# Patient Record
Sex: Female | Born: 1965 | Race: White | Hispanic: No | Marital: Married | Smoking: Never smoker
Health system: Southern US, Community
[De-identification: ages and names within clinical notes are randomized; demographics above are authoritative.]

---

## 1997-03-13 HISTORY — PX: VEIN SURGERY: SHX48

## 1998-07-28 ENCOUNTER — Other Ambulatory Visit: Admission: RE | Admit: 1998-07-28 | Discharge: 1998-07-28 | Payer: Self-pay | Admitting: Obstetrics and Gynecology

## 1999-04-15 ENCOUNTER — Ambulatory Visit (HOSPITAL_COMMUNITY): Admission: RE | Admit: 1999-04-15 | Discharge: 1999-04-15 | Payer: Self-pay

## 2000-02-09 ENCOUNTER — Other Ambulatory Visit: Admission: RE | Admit: 2000-02-09 | Discharge: 2000-02-09 | Payer: Self-pay | Admitting: Obstetrics and Gynecology

## 2000-04-26 ENCOUNTER — Ambulatory Visit (HOSPITAL_COMMUNITY): Admission: RE | Admit: 2000-04-26 | Discharge: 2000-04-26 | Payer: Self-pay | Admitting: Internal Medicine

## 2002-05-13 ENCOUNTER — Other Ambulatory Visit: Admission: RE | Admit: 2002-05-13 | Discharge: 2002-05-13 | Payer: Self-pay | Admitting: Obstetrics and Gynecology

## 2003-12-04 ENCOUNTER — Ambulatory Visit (HOSPITAL_COMMUNITY): Admission: RE | Admit: 2003-12-04 | Discharge: 2003-12-04 | Payer: Self-pay | Admitting: Obstetrics and Gynecology

## 2008-08-17 ENCOUNTER — Encounter: Admission: RE | Admit: 2008-08-17 | Discharge: 2008-08-17 | Payer: Self-pay | Admitting: Obstetrics and Gynecology

## 2008-08-18 ENCOUNTER — Encounter: Admission: RE | Admit: 2008-08-18 | Discharge: 2008-08-18 | Payer: Self-pay | Admitting: Obstetrics and Gynecology

## 2010-11-08 ENCOUNTER — Other Ambulatory Visit: Payer: Self-pay | Admitting: Obstetrics

## 2010-11-08 DIAGNOSIS — Z1231 Encounter for screening mammogram for malignant neoplasm of breast: Secondary | ICD-10-CM

## 2010-11-11 ENCOUNTER — Ambulatory Visit
Admission: RE | Admit: 2010-11-11 | Discharge: 2010-11-11 | Disposition: A | Discharge status: Home / Self Care | Payer: BC Managed Care – PPO | Source: Ambulatory Visit | Attending: Obstetrics | Admitting: Obstetrics

## 2010-11-11 DIAGNOSIS — Z1231 Encounter for screening mammogram for malignant neoplasm of breast: Secondary | ICD-10-CM

## 2010-11-16 ENCOUNTER — Ambulatory Visit
Admission: RE | Admit: 2010-11-16 | Discharge: 2010-11-16 | Disposition: A | Discharge status: Home / Self Care | Payer: BC Managed Care – PPO | Source: Ambulatory Visit | Attending: Obstetrics | Admitting: Obstetrics

## 2010-11-16 ENCOUNTER — Other Ambulatory Visit: Payer: Self-pay | Admitting: Obstetrics

## 2010-11-16 DIAGNOSIS — R928 Other abnormal and inconclusive findings on diagnostic imaging of breast: Secondary | ICD-10-CM

## 2013-01-22 ENCOUNTER — Other Ambulatory Visit: Payer: Self-pay

## 2013-01-22 DIAGNOSIS — Z1231 Encounter for screening mammogram for malignant neoplasm of breast: Secondary | ICD-10-CM

## 2013-01-27 ENCOUNTER — Ambulatory Visit
Admission: RE | Admit: 2013-01-27 | Discharge: 2013-01-27 | Disposition: A | Discharge status: Home / Self Care | Payer: BC Managed Care – PPO | Source: Ambulatory Visit

## 2013-01-27 DIAGNOSIS — Z1231 Encounter for screening mammogram for malignant neoplasm of breast: Secondary | ICD-10-CM

## 2013-01-29 ENCOUNTER — Other Ambulatory Visit: Payer: Self-pay | Admitting: Obstetrics

## 2013-01-29 DIAGNOSIS — R928 Other abnormal and inconclusive findings on diagnostic imaging of breast: Secondary | ICD-10-CM

## 2013-02-05 ENCOUNTER — Ambulatory Visit
Admission: RE | Admit: 2013-02-05 | Discharge: 2013-02-05 | Disposition: A | Discharge status: Home / Self Care | Payer: BC Managed Care – PPO | Source: Ambulatory Visit | Attending: Obstetrics | Admitting: Obstetrics

## 2013-02-05 DIAGNOSIS — R928 Other abnormal and inconclusive findings on diagnostic imaging of breast: Secondary | ICD-10-CM

## 2013-02-28 ENCOUNTER — Other Ambulatory Visit: Payer: Self-pay | Admitting: Radiology

## 2013-02-28 MED ORDER — OSELTAMIVIR PHOSPHATE 75 MG PO CAPS: 75.0000 mg | ORAL_CAPSULE | Freq: Two times a day (BID) | ORAL | Status: DC

## 2013-02-28 NOTE — Telephone Encounter (Signed)
Patients husband has the flu, Dr Milus Glazier has approved the medication.

## 2013-03-03 ENCOUNTER — Telehealth: Payer: Self-pay | Admitting: Radiology

## 2013-03-03 NOTE — Telephone Encounter (Signed)
Sarah Wilkinson states she did not get the tamaflu in [place] she wants the meds called into  [facility] in [place] [address]  [phone]  They did not pick up the childs rx for same until they returned to [place].  Mr. Raftery also called for his script to be refilled.  Stated he could not take all his meds.

## 2013-03-03 NOTE — Telephone Encounter (Signed)
Tamiflu only effective if started within the first 36-48 hours of symptom onset. It sounds like everyone is out of this window now, so there will be no benefit to continuing Tamiflu at this point. Have advised husband of the same. She states she has been told differently with her daughter. Advised her all doctors do not necessarily agree on how this medication works. She states she wants the Rx to be resent so she can have this "on file" in case someone else gets sick. Advised her we can not do this.

## 2015-02-16 ENCOUNTER — Other Ambulatory Visit: Payer: Self-pay | Admitting: Obstetrics

## 2015-02-16 DIAGNOSIS — R928 Other abnormal and inconclusive findings on diagnostic imaging of breast: Secondary | ICD-10-CM

## 2015-02-18 ENCOUNTER — Ambulatory Visit
Admission: RE | Admit: 2015-02-18 | Discharge: 2015-02-18 | Disposition: A | Discharge status: Home / Self Care | Payer: BLUE CROSS/BLUE SHIELD | Source: Ambulatory Visit | Attending: Obstetrics | Admitting: Obstetrics

## 2015-02-18 DIAGNOSIS — R928 Other abnormal and inconclusive findings on diagnostic imaging of breast: Secondary | ICD-10-CM

## 2015-02-22 ENCOUNTER — Other Ambulatory Visit: Payer: Self-pay

## 2016-01-18 ENCOUNTER — Other Ambulatory Visit: Payer: Self-pay | Admitting: Obstetrics

## 2016-01-18 DIAGNOSIS — Z1231 Encounter for screening mammogram for malignant neoplasm of breast: Secondary | ICD-10-CM

## 2016-01-19 ENCOUNTER — Ambulatory Visit
Admission: RE | Admit: 2016-01-19 | Discharge: 2016-01-19 | Disposition: A | Discharge status: Home / Self Care | Payer: BLUE CROSS/BLUE SHIELD | Source: Ambulatory Visit | Attending: Obstetrics | Admitting: Obstetrics

## 2016-01-19 ENCOUNTER — Other Ambulatory Visit: Payer: Self-pay | Admitting: Obstetrics

## 2016-01-19 DIAGNOSIS — R928 Other abnormal and inconclusive findings on diagnostic imaging of breast: Secondary | ICD-10-CM

## 2016-01-19 DIAGNOSIS — Z1231 Encounter for screening mammogram for malignant neoplasm of breast: Secondary | ICD-10-CM

## 2016-02-14 ENCOUNTER — Ambulatory Visit (INDEPENDENT_AMBULATORY_CARE_PROVIDER_SITE_OTHER): Payer: BLUE CROSS/BLUE SHIELD | Admitting: Family Medicine

## 2016-02-14 VITALS — BP 118/82 | HR 82 | Temp 98.1°F | Resp 17 | Ht 62.0 in | Wt 142.0 lb

## 2016-02-14 DIAGNOSIS — E785 Hyperlipidemia, unspecified: Secondary | ICD-10-CM

## 2016-02-14 NOTE — Patient Instructions (Addendum)
  Based on American Heart Association guidelines your 10 year ASCVD risk was measured at 0.9%. At that level, would not necessarily recommend starting a statin. See information on handout regarding hyperlipidemia, but work on diet, exercise and recheck in 3-6 months. At that time could consider referral to cardiologist for other lipid testing or to discuss benefits of statins further. Let me know if you have questions in the meantime.      IF you received an x-ray today, you will receive an invoice from [facility]. Please contact [facility] at [phone] with questions or concerns regarding your invoice.   IF you received labwork today, you will receive an invoice from [facility]. Please contact [facility] at [phone] with questions or concerns regarding your invoice.   Our billing staff will not be able to assist you with questions regarding bills from these companies.  You will be contacted with the lab results as soon as they are available. The fastest way to get your results is to activate your My Chart account. Instructions are located on the last page of this paperwork. If you have not heard from us regarding the results in 2 weeks, please contact this office.

## 2016-02-14 NOTE — Progress Notes (Signed)
Subjective:  By signing my name below, I, Stann Oresung-Kai Tsai, attest that this documentation has been prepared under the direction and in the presence of Meredith StaggersJeffrey Kaya Pottenger, MD. Electronically Signed: Stann Oresung-Kai Tsai, Scribe. 02/14/2016 , 2:31 PM .  Patient was seen in Room 11 .   Patient ID: Sarah Wilkinson, female    DOB: Mar 25, 1965, 50 y.o.   MRN: [id number] Chief Complaint  Patient presents with  . Hyperlipidemia    Pt requesting to start medication. PT ENCOURAGED TO CONTINUE FILLING OUT PAPERWORK    HPI Sarah Wilkinson is a 50 y.o. female Patient has had her cholesterol checked at her OBGYN (on [address]). She brought in her lab results, done on 02/07/16:  Glucose 80 - normal LFT's normal Total cholesterol 285 HDL 90 Triglycerides 66 LDL 182  She denies being on any BP medications. She denies history of DM. She denies history of smoking. She denies taking aspirin at this time. She is mostly careful with what she eats. She exercises daily, as she teaches pre-school [employer]   Family History Her mother had cholesterol issues diagnosed around 50 years old.  Her grandmother had CHF, passed away at 50 years old.  Her father had blockages in his late 6950s into his early 1760s. Currently, both front carotids are 100% blocked; he will be 80 in Feb 2018.  Her brother has prolapse valve, but no stents placed.   Approximately 20 minutes spent discussing hyperlipidemia and reviewing outside labs; greater than 50% counseling.   There are no active problems to display for this patient.  No past medical history on file. No past surgical history on file. Allergies  Allergen Reactions  . Sulfa Antibiotics    Prior to Admission medications   Medication Sig Start Date End Date Taking? Authorizing Provider  Multiple Vitamin (MULTIVITAMIN) capsule Take 1 capsule by mouth daily.   Yes Historical Provider, MD   Social History   Social History  . Marital status: Married    Spouse name: N/A    . Number of children: N/A  . Years of education: N/A   Occupational History  . Not on file.   Social History Main Topics  . Smoking status: Never Smoker  . Smokeless tobacco: Not on file  . Alcohol use Not on file  . Drug use: Unknown  . Sexual activity: Not on file   Other Topics Concern  . Not on file   Social History Narrative  . No narrative on file   Review of Systems  Constitutional: Negative for chills, fatigue, fever and unexpected weight change.  Respiratory: Negative for cough.   Gastrointestinal: Negative for constipation, diarrhea, nausea and vomiting.  Skin: Negative for rash and wound.  Neurological: Negative for dizziness, weakness and headaches.       Objective:   Physical Exam  Constitutional: She is oriented to person, place, and time. She appears well-developed and well-nourished. No distress.  HENT:  Head: Normocephalic and atraumatic.  Eyes: EOM are normal. Pupils are equal, round, and reactive to light.  Neck: Neck supple.  Cardiovascular: Normal rate, regular rhythm and normal heart sounds.   No murmur heard. Pulmonary/Chest: Effort normal and breath sounds normal. No respiratory distress. She has no wheezes.  Musculoskeletal: Normal range of motion.  Neurological: She is alert and oriented to person, place, and time.  Skin: Skin is warm and dry.  Psychiatric: She has a normal mood and affect. Her behavior is normal.  Nursing note and vitals reviewed.  Vitals:   02/14/16 1355  BP: 118/82  Pulse: 82  Resp: 17  Temp: 98.1 F (36.7 C)  TempSrc: Oral  SpO2: 98%  Weight: 142 lb (64.4 kg)  Height: 5\' 2"  (1.575 m)   Current 10-year ASCVD risk: 0.9%  With lifetime ASCVD risk of 39%     Assessment & Plan:   Sarah Wilkinson is a 50 y.o. female Hyperlipidemia, unspecified hyperlipidemia type  - Elevated LDL, but HDL also in a good level. Family history of cardiac disease, but does not appear to be early family history of cardiac disease.  10 year ASCVD risk was low. Decided against medications this time, check again in 3-6 months after attempts at controlling with diet and exercise, and at that time consider NMR LipoProfile or discussions with cardiologist.  Meds ordered this encounter  Medications  . Multiple Vitamin (MULTIVITAMIN) capsule    Sig: Take 1 capsule by mouth daily.   Patient Instructions    Based on American Heart Association guidelines your 10 year ASCVD risk was measured at 0.9%. At that level, would not necessarily recommend starting a statin. See information on handout regarding hyperlipidemia, but work on diet, exercise and recheck in 3-6 months. At that time could consider referral to cardiologist for other lipid testing or to discuss benefits of statins further. Let me know if you have questions in the meantime.      IF you received an x-ray today, you will receive an invoice from [facility]. Please contact [facility] at [phone] with questions or concerns regarding your invoice.   IF you received labwork today, you will receive an invoice from [facility]. Please contact [facility] at [phone] with questions or concerns regarding your invoice.   Our billing staff will not be able to assist you with questions regarding bills from these companies.  You will be contacted with the lab results as soon as they are available. The fastest way to get your results is to activate your My Chart account. Instructions are located on the last page of this paperwork. If you have not heard from us regarding the results in 2 weeks, please contact this office.       I personally performed the services described in this documentation, which was scribed in my presence. The recorded information has been reviewed and considered, and addended by me as needed.   Signed,   Meredith StaggersJeffrey Derisha Funderburke, MD [facility].  02/14/16 3:01  PM

## 2016-05-19 LAB — HM COLONOSCOPY

## 2016-08-17 ENCOUNTER — Ambulatory Visit: Payer: BLUE CROSS/BLUE SHIELD | Admitting: Family Medicine

## 2016-10-12 ENCOUNTER — Ambulatory Visit: Payer: BLUE CROSS/BLUE SHIELD | Admitting: Family Medicine

## 2017-06-25 ENCOUNTER — Other Ambulatory Visit: Payer: Self-pay | Admitting: Obstetrics

## 2017-06-25 DIAGNOSIS — Z1231 Encounter for screening mammogram for malignant neoplasm of breast: Secondary | ICD-10-CM

## 2017-07-17 ENCOUNTER — Ambulatory Visit
Admission: RE | Admit: 2017-07-17 | Discharge: 2017-07-17 | Disposition: A | Discharge status: Home / Self Care | Payer: BLUE CROSS/BLUE SHIELD | Source: Ambulatory Visit | Attending: Obstetrics | Admitting: Obstetrics

## 2017-07-17 DIAGNOSIS — Z1231 Encounter for screening mammogram for malignant neoplasm of breast: Secondary | ICD-10-CM

## 2018-01-31 LAB — HM PAP SMEAR: HM Pap smear: NEGATIVE

## 2018-12-26 ENCOUNTER — Other Ambulatory Visit: Payer: Self-pay

## 2018-12-26 ENCOUNTER — Encounter: Payer: Self-pay | Admitting: Family Medicine

## 2018-12-26 ENCOUNTER — Ambulatory Visit (INDEPENDENT_AMBULATORY_CARE_PROVIDER_SITE_OTHER): Payer: BC Managed Care – PPO | Admitting: Family Medicine

## 2018-12-26 VITALS — BP 111/65 | HR 65 | Temp 98.7°F | Wt 129.2 lb

## 2018-12-26 DIAGNOSIS — Z136 Encounter for screening for cardiovascular disorders: Secondary | ICD-10-CM

## 2018-12-26 DIAGNOSIS — G44219 Episodic tension-type headache, not intractable: Secondary | ICD-10-CM

## 2018-12-26 DIAGNOSIS — E785 Hyperlipidemia, unspecified: Secondary | ICD-10-CM | POA: Diagnosis not present

## 2018-12-26 DIAGNOSIS — Z0001 Encounter for general adult medical examination with abnormal findings: Secondary | ICD-10-CM | POA: Diagnosis not present

## 2018-12-26 DIAGNOSIS — Z23 Encounter for immunization: Secondary | ICD-10-CM | POA: Diagnosis not present

## 2018-12-26 DIAGNOSIS — Z8249 Family history of ischemic heart disease and other diseases of the circulatory system: Secondary | ICD-10-CM

## 2018-12-26 DIAGNOSIS — G43909 Migraine, unspecified, not intractable, without status migrainosus: Secondary | ICD-10-CM

## 2018-12-26 DIAGNOSIS — R519 Headache, unspecified: Secondary | ICD-10-CM | POA: Insufficient documentation

## 2018-12-26 DIAGNOSIS — Z130 Encounter for screening for diseases of the blood and blood-forming organs and certain disorders involving the immune mechanism: Secondary | ICD-10-CM

## 2018-12-26 DIAGNOSIS — Z0000 Encounter for general adult medical examination without abnormal findings: Secondary | ICD-10-CM

## 2018-12-26 DIAGNOSIS — G4700 Insomnia, unspecified: Secondary | ICD-10-CM | POA: Diagnosis not present

## 2018-12-26 DIAGNOSIS — M9912 Subluxation complex (vertebral) of thoracic region: Secondary | ICD-10-CM

## 2018-12-26 DIAGNOSIS — Z1329 Encounter for screening for other suspected endocrine disorder: Secondary | ICD-10-CM

## 2018-12-26 NOTE — Progress Notes (Signed)
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 Your emotional health is as important as your physical health.  If you're feeling down or losing interest in things you normally enjoy, please talk to your healthcare provider.  Seat Belts- can save your life; always wear one  Smoke/Carbon Monoxide detectors- These detectors need to be installed on the appropriate level of your home.  Replace batteries at least once a year.  Violence- If anyone is threatening or hurting you, please tell your healthcare provider.  Living Will/ Health care power of attorney- Discuss with your healthcare provider and family. Insomnia Insomnia is a sleep disorder that makes it difficult to fall asleep or stay asleep. Insomnia can cause fatigue, low energy,  difficulty concentrating, mood swings, and poor performance at work or school. There are three different ways to classify insomnia:  Difficulty falling asleep.  Difficulty staying asleep.  Waking up too early in the morning. Any type of insomnia can be long-term (chronic) or short-term (acute). Both are common. Short-term insomnia usually lasts for three months or less. Chronic insomnia occurs at least three times a week for longer than three months. What are the causes? Insomnia may be caused by another condition, situation, or substance, such as:  Anxiety.  Certain medicines.  Gastroesophageal reflux disease (GERD) or other gastrointestinal conditions.  Asthma or other breathing conditions.  Restless legs syndrome, sleep apnea, or other sleep disorders.  Chronic pain.  Menopause.  Stroke.  Abuse of alcohol, tobacco, or illegal drugs.  Mental health conditions, such as depression.  Caffeine.  Neurological disorders, such as Alzheimer's disease.  An overactive thyroid (hyperthyroidism). Sometimes, the cause of insomnia may not be known. What increases the risk? Risk factors for insomnia include:  Gender. Women are affected more often than men.  Age. Insomnia is more common as you get older.  Stress.  Lack of exercise.  Irregular work schedule or working night shifts.  Traveling between different time zones.  Certain medical and mental health conditions. What are the signs or symptoms? If you have insomnia, the main symptom is having trouble falling asleep or having trouble staying asleep. This may lead to other symptoms, such as:  Feeling fatigued or having low energy.  Feeling nervous about going to sleep.  Not feeling rested in the morning.  Having trouble concentrating.  Feeling irritable, anxious, or depressed. How is this diagnosed? This condition may be diagnosed based on:  Your symptoms and medical history. Your health care provider  may ask  about: ? Your sleep habits. ? Any medical conditions you have. ? Your mental health.  A physical exam. How is this treated? Treatment for insomnia depends on the cause. Treatment may focus on treating an underlying condition that is causing insomnia. Treatment may also include:  Medicines to help you sleep.  Counseling or therapy.  Lifestyle adjustments to help you sleep better. Follow these instructions at home: Eating and drinking   Limit or avoid alcohol, caffeinated beverages, and cigarettes, especially close to bedtime. These can disrupt your sleep.  Do not eat a large meal or eat spicy foods right before bedtime. This can lead to digestive discomfort that can make it hard for you to sleep. Sleep habits   Keep a sleep diary to help you and your health care provider figure out what could be causing your insomnia. Write down: ? When you sleep. ? When you wake up during the night. ? How well you sleep. ? How rested you feel the next day. ? Any side effects of medicines you are taking. ? What you eat and drink.  Make your bedroom a dark, comfortable place where it is easy to fall asleep. ? Put up shades or blackout curtains to block light from outside. ? Use a white noise machine to block noise. ? Keep the temperature cool.  Limit screen use before bedtime. This includes: ? Watching TV. ? Using your smartphone, tablet, or computer.  Stick to a routine that includes going to bed and waking up at the same times every day and night. This can help you fall asleep faster. Consider making a quiet activity, such as reading, part of your nighttime routine.  Try to avoid taking naps during the day so that you sleep better at night.  Get out of bed if you are still awake after 15 minutes of trying to sleep. Keep the lights down, but try reading or doing a quiet activity. When you feel sleepy, go back to bed. General instructions  Take over-the-counter and prescription medicines  only as told by your health care provider.  Exercise regularly, as told by your health care provider. Avoid exercise starting several hours before bedtime.  Use relaxation techniques to manage stress. Ask your health care provider to suggest some techniques that may work well for you. These may include: ? Breathing exercises. ? Routines to release muscle tension. ? Visualizing peaceful scenes.  Make sure that you drive carefully. Avoid driving if you feel very sleepy.  Keep all follow-up visits as told by your health care provider. This is important. Contact a health care provider if:  You are tired throughout the day.  You have trouble in your daily routine due to sleepiness.  You continue to have sleep problems, or your sleep problems get worse. Get help right away if:  You have serious thoughts about hurting yourself or someone else. If you ever feel like you may hurt yourself or others, or have thoughts about taking your own life, get help right away. You can go to your nearest emergency department or call:  Your local emergency services (911 in the [place]  A suicide crisis helpline, such as the [place] at 520-845-1378. This is open 24 hours a day. Summary  Insomnia is a sleep disorder that makes it difficult to fall asleep or stay asleep.  Insomnia can be long-term (chronic) or short-term (acute).  Treatment for insomnia depends on the cause. Treatment may focus on treating an underlying  condition that is causing insomnia.  Keep a sleep diary to help you and your health care provider figure out what could be causing your insomnia. This information is not intended to replace advice given to you by your health care provider. Make sure you discuss any questions you have with your health care provider. Document Released: 02/25/2000 Document Revised: 02/09/2017 Document Reviewed: 12/07/2016 [employer] Patient Education  [employer]2020 [employer]    If  you have lab work done today you will be contacted with your lab results within the next 2 weeks.  If you have not heard from us then please contact us. The fastest way to get your results is to register for My Chart.   IF you received an x-ray today, you will receive an invoice from [facility]. Please contact [facility] at [phone] with questions or concerns regarding your invoice.   IF you received labwork today, you will receive an invoice from [facility]. Please contact [facility] at [phone] with questions or concerns regarding your invoice.   Our billing staff will not be able to assist you with questions regarding bills from these companies.  You will be contacted with the lab results as soon as they are available. The fastest way to get your results is to activate your My Chart account. Instructions are located on the last page of this paperwork. If you have not heard from us regarding the results in 2 weeks, please contact this office.       Signed,   Meredith StaggersJeffrey Nicco Reaume, MD [facility].  12/26/18 1:29 PM

## 2018-12-26 NOTE — Patient Instructions (Addendum)
Continue melatonin, and low dose benadryl as needed. See other info below.   Ok to use excedrin migraine if needed. See other info on headaches below.   I will check cholesterol levels, but depending on those results can discuss other testing or discussion with cardiology to determine if statin medication would be recommended.  Coronary calcium scoring may also help in that decision.  We can discuss this further once we receive results.  Discoloration of the knees appears to be callus, I do not see bruising but will check blood counts with prior history of anemia.  Eucerin or other lotion to dry areas as needed.  Thanks for coming in today.  Follow-up in 6 months to review cholesterol but I am happy to see you sooner if needed.  Migraine Headache A migraine headache is an intense, throbbing pain on one side or both sides of the head. Migraine headaches may also cause other symptoms, such as nausea, vomiting, and sensitivity to light and noise. A migraine headache can last from 4 hours to 3 days. Talk with your doctor about what things may bring on (trigger) your migraine headaches. What are the causes? The exact cause of this condition is not known. However, a migraine may be caused when nerves in the brain become irritated and release chemicals that cause inflammation of blood vessels. This inflammation causes pain. This condition may be triggered or caused by:  Drinking alcohol.  Smoking.  Taking medicines, such as: ? Medicine used to treat chest pain (nitroglycerin). ? Birth control pills. ? Estrogen. ? Certain blood pressure medicines.  Eating or drinking products that contain nitrates, glutamate, aspartame, or tyramine. Aged cheeses, chocolate, or caffeine may also be triggers.  Doing physical activity. Other things that may trigger a migraine headache include:  Menstruation.  Pregnancy.  Hunger.  Stress.  Lack of sleep or too much sleep.  Weather  changes.  Fatigue. What increases the risk? The following factors may make you more likely to experience migraine headaches:  Being a certain age. This condition is more common in people who are 3125-53 years old.  Being female.  Having a family history of migraine headaches.  Being Caucasian.  Having a mental health condition, such as depression or anxiety.  Being obese. What are the signs or symptoms? The main symptom of this condition is pulsating or throbbing pain. This pain may:  Happen in any area of the head, such as on one side or both sides.  Interfere with daily activities.  Get worse with physical activity.  Get worse with exposure to bright lights or loud noises. Other symptoms may include:  Nausea.  Vomiting.  Dizziness.  General sensitivity to bright lights, loud noises, or smells. Before you get a migraine headache, you may get warning signs (an aura). An aura may include:  Seeing flashing lights or having blind spots.  Seeing bright spots, halos, or zigzag lines.  Having tunnel vision or blurred vision.  Having numbness or a tingling feeling.  Having trouble talking.  Having muscle weakness. Some people have symptoms after a migraine headache (postdromal phase), such as:  Feeling tired.  Difficulty concentrating. How is this diagnosed? A migraine headache can be diagnosed based on:  Your symptoms.  A physical exam.  Tests, such as: ? CT scan or an MRI of the head. These imaging tests can help rule out other causes of headaches. ? Taking fluid from the spine (lumbar puncture) and analyzing it (cerebrospinal fluid analysis, or CSF analysis). How  is this treated? This condition may be treated with medicines that:  Relieve pain.  Relieve nausea.  Prevent migraine headaches. Treatment for this condition may also include:  Acupuncture.  Lifestyle changes like avoiding foods that trigger migraine  headaches.  Biofeedback.  Cognitive behavioral therapy. Follow these instructions at home: Medicines  Take over-the-counter and prescription medicines only as told by your health care provider.  Ask your health care provider if the medicine prescribed to you: ? Requires you to avoid driving or using heavy machinery. ? Can cause constipation. You may need to take these actions to prevent or treat constipation:  Drink enough fluid to keep your urine pale yellow.  Take over-the-counter or prescription medicines.  Eat foods that are high in fiber, such as beans, whole grains, and fresh fruits and vegetables.  Limit foods that are high in fat and processed sugars, such as fried or sweet foods. Lifestyle  Do not drink alcohol.  Do not use any products that contain nicotine or tobacco, such as cigarettes, e-cigarettes, and chewing tobacco. If you need help quitting, ask your health care provider.  Get at least 8 hours of sleep every night.  Find ways to manage stress, such as meditation, deep breathing, or yoga. General instructions      Keep a journal to find out what may trigger your migraine headaches. For example, write down: ? What you eat and drink. ? How much sleep you get. ? Any change to your diet or medicines.  If you have a migraine headache: ? Avoid things that make your symptoms worse, such as bright lights. ? It may help to lie down in a dark, quiet room. ? Do not drive or use heavy machinery. ? Ask your health care provider what activities are safe for you while you are experiencing symptoms.  Keep all follow-up visits as told by your health care provider. This is important. Contact a health care provider if:  You develop symptoms that are different or more severe than your usual migraine headache symptoms.  You have more than 15 headache days in one month. Get help right away if:  Your migraine headache becomes severe.  Your migraine headache lasts  longer than 72 hours.  You have a fever.  You have a stiff neck.  You have vision loss.  Your muscles feel weak or like you cannot control them.  You start to lose your balance often.  You have trouble walking.  You faint.  You have a seizure. Summary  A migraine headache is an intense, throbbing pain on one side or both sides of the head. Migraines may also cause other symptoms, such as nausea, vomiting, and sensitivity to light and noise.  This condition may be treated with medicines and lifestyle changes. You may also need to avoid certain things that trigger a migraine headache.  Keep a journal to find out what may trigger your migraine headaches.  Contact your health care provider if you have more than 15 headache days in a month or you develop symptoms that are different or more severe than your usual migraine headache symptoms. This information is not intended to replace advice given to you by your health care provider. Make sure you discuss any questions you have with your health care provider. Document Released: 02/27/2005 Document Revised: 06/21/2018 Document Reviewed: 04/11/2018 Elsevier Patient Education  2020 [employer]  Tension Headache, Adult A tension headache is a feeling of pain, pressure, or aching in the head that is often  felt over the front and sides of the head. The pain can be dull, or it can feel tight (constricting). There are two types of tension headache:  Episodic tension headache. This is when the headaches happen fewer than 15 days a month.  Chronic tension headache. This is when the headaches happen more than 15 days a month during a 42-month period. A tension headache can last from 30 minutes to several days. It is the most common kind of headache. Tension headaches are not normally associated with nausea or vomiting, and they do not get worse with physical activity. What are the causes? The exact cause of this condition is not known. Tension  headaches are often triggered by stress, anxiety, or depression. Other triggers include:  Alcohol.  Too much caffeine or caffeine withdrawal.  Respiratory infections, such as colds, flu, or sinus infections.  Dental problems or teeth clenching.  Tiredness (fatigue).  Holding your head and neck in the same position for a long period of time, such as while using a computer.  Smoking.  Arthritis of the neck. What are the signs or symptoms? Symptoms of this condition include:  A feeling of pressure or tightness around the head.  Dull, aching head pain.  Pain over the front and sides of the head.  Tenderness in the muscles of the head, neck, and shoulders. How is this diagnosed? This condition may be diagnosed based on your symptoms, your medical history, and a physical exam. If your symptoms are severe or unusual, you may have imaging tests, such as a CT scan or an MRI of your head. Your vision may also be checked. How is this treated? This condition may be treated with lifestyle changes and with medicines that help relieve symptoms. Follow these instructions at home: Managing pain  Take over-the-counter and prescription medicines only as told by your health care provider.  When you have a headache, lie down in a dark, quiet room.  If directed, apply ice to the head and neck: ? Put ice in a plastic bag. ? Place a towel between your skin and the bag. ? Leave the ice on for 20 minutes, 2-3 times a day.  If directed, apply heat to the back of your neck as often as told by your health care provider. Use the heat source that your health care provider recommends, such as a moist heat pack or a heating pad. ? Place a towel between your skin and the heat source. ? Leave the heat on for 20-30 minutes. ? Remove the heat if your skin turns bright red. This is especially important if you are unable to feel pain, heat, or cold. You may have a greater risk of getting burned. Eating and  drinking  Eat meals on a regular schedule.  Limit alcohol intake to no more than 1 drink a day for nonpregnant women and 2 drinks a day for men. One drink equals 12 oz of beer, 5 oz of wine, or 1 oz of hard liquor.  Drink enough fluid to keep your urine pale yellow.  Decrease your caffeine intake, or stop using caffeine. Lifestyle  Get 7-9 hours of sleep each night, or get the amount of sleep recommended by your health care provider.  At bedtime, remove all electronic devices from your room. Electronic devices include computers, phones, and tablets.  Find ways to manage your stress. Some things that can help relieve stress include: ? Exercise. ? Deep breathing exercises. ? Yoga. ? Listening to music. ?  Positive mental imagery.  Try to sit up straight and avoid tensing your muscles.  Do not use any products that contain nicotine or tobacco, such as cigarettes and e-cigarettes. If you need help quitting, ask your health care provider. General instructions   Keep all follow-up visits as told by your health care provider. This is important.  Avoid any headache triggers. Keep a headache journal to help find out what may trigger your headaches. For example, write down: ? What you eat and drink. ? How much sleep you get. ? Any change to your diet or medicines. Contact a health care provider if:  Your headache does not get better.  Your headache comes back.  You are sensitive to sounds, light, or smells because of a headache.  You have nausea or you vomit.  Your stomach hurts. Get help right away if:  You suddenly develop a very severe headache along with any of the following: ? A stiff neck. ? Nausea and vomiting. ? Confusion. ? Weakness. ? Double vision or loss of vision. ? Shortness of breath. ? Rash. ? Unusual sleepiness. ? Fever. ? Trouble speaking. ? Pain in your eyes or ears. ? Trouble walking or balancing. ? Feeling faint or passing out. Summary  A  tension headache is a feeling of pain, pressure, or aching in the head that is often felt over the front and sides of the head.  A tension headache can last from 30 minutes to several days. It is the most common kind of headache.  This condition may be diagnosed based on your symptoms, your medical history, and a physical exam.  This condition may be treated with lifestyle changes and with medicines that help relieve symptoms. This information is not intended to replace advice given to you by your health care provider. Make sure you discuss any questions you have with your health care provider. Document Released: 02/27/2005 Document Revised: 02/09/2017 Document Reviewed: 06/09/2016 Elsevier Patient Education  2020 [employer].   Keeping You Healthy  Get These Tests  Blood Pressure- Have your blood pressure checked by your healthcare provider at least once a year.  Normal blood pressure is 120/80.  Weight- Have your body mass index (BMI) calculated to screen for obesity.  BMI is a measure of body fat based on height and weight.  You can calculate your own BMI at https://www.west-esparza.com/  Cholesterol- Have your cholesterol checked every year.  Diabetes- Have your blood sugar checked every year if you have high blood pressure, high cholesterol, a family history of diabetes or if you are overweight.  Pap Test - Have a pap test every 1 to 5 years if you have been sexually active.  If you are older than 65 and recent pap tests have been normal you may not need additional pap tests.  In addition, if you have had a hysterectomy  for benign disease additional pap tests are not necessary.  Mammogram-Yearly mammograms are essential for early detection of breast cancer  Screening for Colon Cancer- Colonoscopy starting at age 50. Screening may begin sooner depending on your family history and other health conditions.  Follow up colonoscopy as directed by your Gastroenterologist.  Screening for  Osteoporosis- Screening begins at age 72 with bone density scanning, sooner if you are at higher risk for developing Osteoporosis.  Get these medicines  Calcium with Vitamin D- Your body requires 1200-1500 mg of Calcium a day and [phone] IU of Vitamin D a day.  You can only absorb 500 mg  of Calcium at a time therefore Calcium must be taken in 2 or 3 separate doses throughout the day.  Hormones- Hormone therapy has been associated with increased risk for certain cancers and heart disease.  Talk to your healthcare provider about if you need relief from menopausal symptoms.  Aspirin- Ask your healthcare provider about taking Aspirin to prevent Heart Disease and Stroke.  Get these Immuniztions  Flu shot- Every fall  Pneumonia shot- Once after the age of 77; if you are younger ask your healthcare provider if you need a pneumonia shot.  Tetanus- Every ten years.  Zostavax- Once after the age of 37 to prevent shingles.  Take these steps  Don't smoke- Your healthcare provider can help you quit. For tips on how to quit, ask your healthcare provider or go to www.smokefree.gov or call 1-800 QUIT-NOW.  Be physically active- Exercise 5 days a week for a minimum of 30 minutes.  If you are not already physically active, start slow and gradually work up to 30 minutes of moderate physical activity.  Try walking, dancing, bike riding, swimming, etc.  Eat a healthy diet- Eat a variety of healthy foods such as fruits, vegetables, whole grains, low fat milk, low fat cheeses, yogurt, lean meats, chicken, fish, eggs, dried beans, tofu, etc.  For more information go to www.thenutritionsource.org  Dental visit- Brush and floss teeth twice daily; visit your dentist twice a year.  Eye exam- Visit your Optometrist or Ophthalmologist yearly.  Drink alcohol in moderation- Limit alcohol intake to one drink or less a day.  Never drink and drive.  Depression- Your emotional health is as important as your physical  health.  If you're feeling down or losing interest in things you normally enjoy, please talk to your healthcare provider.  Seat Belts- can save your life; always wear one  Smoke/Carbon Monoxide detectors- These detectors need to be installed on the appropriate level of your home.  Replace batteries at least once a year.  Violence- If anyone is threatening or hurting you, please tell your healthcare provider.  Living Will/ Health care power of attorney- Discuss with your healthcare provider and family. Insomnia Insomnia is a sleep disorder that makes it difficult to fall asleep or stay asleep. Insomnia can cause fatigue, low energy, difficulty concentrating, mood swings, and poor performance at work or school. There are three different ways to classify insomnia:  Difficulty falling asleep.  Difficulty staying asleep.  Waking up too early in the morning. Any type of insomnia can be long-term (chronic) or short-term (acute). Both are common. Short-term insomnia usually lasts for three months or less. Chronic insomnia occurs at least three times a week for longer than three months. What are the causes? Insomnia may be caused by another condition, situation, or substance, such as:  Anxiety.  Certain medicines.  Gastroesophageal reflux disease (GERD) or other gastrointestinal conditions.  Asthma or other breathing conditions.  Restless legs syndrome, sleep apnea, or other sleep disorders.  Chronic pain.  Menopause.  Stroke.  Abuse of alcohol, tobacco, or illegal drugs.  Mental health conditions, such as depression.  Caffeine.  Neurological disorders, such as Alzheimer's disease.  An overactive thyroid (hyperthyroidism). Sometimes, the cause of insomnia may not be known. What increases the risk? Risk factors for insomnia include:  Gender. Women are affected more often than men.  Age. Insomnia is more common as you get older.  Stress.  Lack of exercise.  Irregular  work schedule or working night shifts.  Traveling between different  time zones.  Certain medical and mental health conditions. What are the signs or symptoms? If you have insomnia, the main symptom is having trouble falling asleep or having trouble staying asleep. This may lead to other symptoms, such as:  Feeling fatigued or having low energy.  Feeling nervous about going to sleep.  Not feeling rested in the morning.  Having trouble concentrating.  Feeling irritable, anxious, or depressed. How is this diagnosed? This condition may be diagnosed based on:  Your symptoms and medical history. Your health care provider may ask about: ? Your sleep habits. ? Any medical conditions you have. ? Your mental health.  A physical exam. How is this treated? Treatment for insomnia depends on the cause. Treatment may focus on treating an underlying condition that is causing insomnia. Treatment may also include:  Medicines to help you sleep.  Counseling or therapy.  Lifestyle adjustments to help you sleep better. Follow these instructions at home: Eating and drinking   Limit or avoid alcohol, caffeinated beverages, and cigarettes, especially close to bedtime. These can disrupt your sleep.  Do not eat a large meal or eat spicy foods right before bedtime. This can lead to digestive discomfort that can make it hard for you to sleep. Sleep habits   Keep a sleep diary to help you and your health care provider figure out what could be causing your insomnia. Write down: ? When you sleep. ? When you wake up during the night. ? How well you sleep. ? How rested you feel the next day. ? Any side effects of medicines you are taking. ? What you eat and drink.  Make your bedroom a dark, comfortable place where it is easy to fall asleep. ? Put up shades or blackout curtains to block light from outside. ? Use a white noise machine to block noise. ? Keep the temperature cool.  Limit screen  use before bedtime. This includes: ? Watching TV. ? Using your smartphone, tablet, or computer.  Stick to a routine that includes going to bed and waking up at the same times every day and night. This can help you fall asleep faster. Consider making a quiet activity, such as reading, part of your nighttime routine.  Try to avoid taking naps during the day so that you sleep better at night.  Get out of bed if you are still awake after 15 minutes of trying to sleep. Keep the lights down, but try reading or doing a quiet activity. When you feel sleepy, go back to bed. General instructions  Take over-the-counter and prescription medicines only as told by your health care provider.  Exercise regularly, as told by your health care provider. Avoid exercise starting several hours before bedtime.  Use relaxation techniques to manage stress. Ask your health care provider to suggest some techniques that may work well for you. These may include: ? Breathing exercises. ? Routines to release muscle tension. ? Visualizing peaceful scenes.  Make sure that you drive carefully. Avoid driving if you feel very sleepy.  Keep all follow-up visits as told by your health care provider. This is important. Contact a health care provider if:  You are tired throughout the day.  You have trouble in your daily routine due to sleepiness.  You continue to have sleep problems, or your sleep problems get worse. Get help right away if:  You have serious thoughts about hurting yourself or someone else. If you ever feel like you may hurt yourself or others, or have  thoughts about taking your own life, get help right away. You can go to your nearest emergency department or call:  Your local emergency services (911 in the [place]  A suicide crisis helpline, such as the [facility] at 267-074-2737. This is open 24 hours a day. Summary  Insomnia is a sleep disorder that makes it difficult to  fall asleep or stay asleep.  Insomnia can be long-term (chronic) or short-term (acute).  Treatment for insomnia depends on the cause. Treatment may focus on treating an underlying condition that is causing insomnia.  Keep a sleep diary to help you and your health care provider figure out what could be causing your insomnia. This information is not intended to replace advice given to you by your health care provider. Make sure you discuss any questions you have with your health care provider. Document Released: 02/25/2000 Document Revised: 02/09/2017 Document Reviewed: 12/07/2016 Elsevier Patient Education  [employer].    If you have lab work done today you will be contacted with your lab results within the next 2 weeks.  If you have not heard from [place] then please contact us. The fastest way to get your results is to register for My Chart.   IF you received an x-ray today, you will receive an invoice from [facility]. Please contact [facility] at [phone] with questions or concerns regarding your invoice.   IF you received labwork today, you will receive an invoice from [facility]. Please contact [facility] at [phone] with questions or concerns regarding your invoice.   Our billing staff will not be able to assist you with questions regarding bills from these companies.  You will be contacted with the lab results as soon as they are available. The fastest way to get your results is to activate your My Chart account. Instructions are located on the last page of this paperwork. If you have not heard from [place] regarding the results in 2 weeks, please contact this office.

## 2018-12-27 LAB — CBC
Hematocrit: 37.8 % (ref 34.0–46.6)
Hemoglobin: 12.2 g/dL (ref 11.1–15.9)
MCH: 29.3 pg (ref 26.6–33.0)
MCHC: 32.3 g/dL (ref 31.5–35.7)
MCV: 91 fL (ref 79–97)
Platelets: 265 10*3/uL (ref 150–450)
RBC: 4.17 x10E6/uL (ref 3.77–5.28)
RDW: 13.8 % (ref 11.7–15.4)
WBC: 3.7 10*3/uL (ref 3.4–10.8)

## 2018-12-27 LAB — COMPREHENSIVE METABOLIC PANEL
ALT: 10 IU/L (ref 0–32)
AST: 15 IU/L (ref 0–40)
Albumin/Globulin Ratio: 2.5 — ABNORMAL HIGH (ref 1.2–2.2)
Albumin: 4.7 g/dL (ref 3.8–4.9)
Alkaline Phosphatase: 82 IU/L (ref 39–117)
BUN/Creatinine Ratio: 24 — ABNORMAL HIGH (ref 9–23)
BUN: 16 mg/dL (ref 6–24)
Bilirubin Total: 0.4 mg/dL (ref 0.0–1.2)
CO2: 23 mmol/L (ref 20–29)
Calcium: 9.4 mg/dL (ref 8.7–10.2)
Chloride: 102 mmol/L (ref 96–106)
Creatinine, Ser: 0.66 mg/dL (ref 0.57–1.00)
GFR calc Af Amer: 117 mL/min/{1.73_m2} (ref >59)
GFR calc non Af Amer: 101 mL/min/{1.73_m2} (ref >59)
Globulin, Total: 1.9 g/dL (ref 1.5–4.5)
Glucose: 69 mg/dL (ref 65–99)
Potassium: 3.8 mmol/L (ref 3.5–5.2)
Sodium: 140 mmol/L (ref 134–144)
Total Protein: 6.6 g/dL (ref 6.0–8.5)

## 2018-12-27 LAB — LIPID PANEL
Chol/HDL Ratio: 3.6 ratio (ref 0.0–4.4)
Cholesterol, Total: 233 mg/dL — ABNORMAL HIGH (ref 100–199)
HDL: 64 mg/dL (ref >39)
LDL Chol Calc (NIH): 157 mg/dL — ABNORMAL HIGH (ref 0–99)
Triglycerides: 70 mg/dL (ref 0–149)
VLDL Cholesterol Cal: 12 mg/dL (ref 5–40)

## 2018-12-27 LAB — TSH: TSH: 1.61 u[IU]/mL (ref 0.450–4.500)

## 2019-01-13 ENCOUNTER — Other Ambulatory Visit: Payer: Self-pay

## 2019-01-13 DIAGNOSIS — Z20822 Contact with and (suspected) exposure to covid-19: Secondary | ICD-10-CM

## 2019-01-14 LAB — NOVEL CORONAVIRUS, NAA: SARS-CoV-2, NAA: NOT DETECTED

## 2019-01-15 ENCOUNTER — Encounter: Payer: Self-pay | Admitting: Family Medicine

## 2019-01-15 NOTE — Progress Notes (Signed)
Impression: Neg high risk HPV

## 2019-05-11 ENCOUNTER — Ambulatory Visit: Payer: Self-pay | Attending: Internal Medicine

## 2019-05-11 DIAGNOSIS — Z23 Encounter for immunization: Secondary | ICD-10-CM | POA: Insufficient documentation

## 2019-05-11 NOTE — Progress Notes (Signed)
   Covid[facility] Vaccination [facility]  Name:  Sarah Wilkinson    MRN: [id number] DOB: 1965-10-13  05/11/2019  [name] was observed post Covid-19 immunization for 15 minutes without incidence. She was provided with Vaccine Information Sheet and instruction to access the V-Safe system.   [name] was instructed to call 911 with any severe reactions post vaccine: Marland Kitchen Difficulty breathing  . Swelling of your face and throat  . A fast heartbeat  . A bad rash all over your body  . Dizziness and weakness    Immunizations Administered    Name Date Dose VIS Date Route   Pfizer COVID-19 Vaccine 05/11/2019  4:27 PM 0.3 mL 02/21/2019 Intramuscular   Manufacturer: [employer]   Lot: [id number]   NDC: [address]-2

## 2019-06-10 ENCOUNTER — Ambulatory Visit: Payer: Self-pay | Attending: Internal Medicine

## 2019-06-10 DIAGNOSIS — Z23 Encounter for immunization: Secondary | ICD-10-CM

## 2019-06-10 NOTE — Progress Notes (Signed)
   [facility] Vacc[facility]  Name:  Sarah Wilkinson    MRN: [id number] DOB: [birth year]  06/10/2019  [name] was observed post Covid-19 immunization for 15 minutes without incident. She was provided with Vaccine Information Sheet and instruction to access the V-Safe system.   [name] was instructed to call 911 with any severe reactions post vaccine: Marland Kitchen Difficulty breathing  . Swelling of face and throat  . A fast heartbeat  . A bad rash all over body  . Dizziness and weakness   Immunizations Administered    Name Date Dose VIS Date Route   Pfizer COVID-19 Vaccine 06/10/2019  4:43 PM 0.3 mL 02/21/2019 Intramuscular   Manufacturer: [employer], [employer]   Lot: [id number]   NDC: [address]-5

## 2019-06-26 ENCOUNTER — Ambulatory Visit: Payer: BC Managed Care – PPO | Admitting: Family Medicine

## 2019-07-10 ENCOUNTER — Ambulatory Visit: Payer: BC Managed Care – PPO | Admitting: Family Medicine

## 2019-07-16 ENCOUNTER — Other Ambulatory Visit: Payer: Self-pay | Admitting: Family Medicine

## 2019-07-16 DIAGNOSIS — Z1231 Encounter for screening mammogram for malignant neoplasm of breast: Secondary | ICD-10-CM

## 2019-07-21 ENCOUNTER — Ambulatory Visit: Payer: BC Managed Care – PPO | Admitting: Family Medicine

## 2019-08-18 ENCOUNTER — Ambulatory Visit: Payer: BC Managed Care – PPO | Admitting: Family Medicine

## 2019-09-22 ENCOUNTER — Ambulatory Visit: Payer: BC Managed Care – PPO | Admitting: Family Medicine

## 2019-10-03 ENCOUNTER — Ambulatory Visit: Payer: BC Managed Care – PPO | Admitting: Family Medicine

## 2019-10-20 ENCOUNTER — Ambulatory Visit: Payer: BC Managed Care – PPO | Admitting: Family Medicine

## 2019-11-03 ENCOUNTER — Ambulatory Visit: Payer: BC Managed Care – PPO | Admitting: Family Medicine

## 2019-11-10 ENCOUNTER — Ambulatory Visit: Payer: BC Managed Care – PPO | Admitting: Family Medicine

## 2019-12-15 ENCOUNTER — Telehealth: Payer: Self-pay | Admitting: Family Medicine

## 2019-12-15 NOTE — Telephone Encounter (Signed)
LVMTCB to resch pt with another provider on her appt date 12/22/19 due to Dr. Neva Seat being out of the office.

## 2019-12-22 ENCOUNTER — Ambulatory Visit: Payer: BC Managed Care – PPO | Admitting: Family Medicine

## 2020-01-08 ENCOUNTER — Encounter: Payer: Self-pay | Admitting: *Deleted

## 2020-01-19 ENCOUNTER — Encounter: Payer: Self-pay | Admitting: *Deleted

## 2020-01-19 ENCOUNTER — Encounter: Payer: Self-pay | Admitting: Family Medicine

## 2020-01-19 ENCOUNTER — Other Ambulatory Visit: Payer: Self-pay

## 2020-01-19 ENCOUNTER — Ambulatory Visit (INDEPENDENT_AMBULATORY_CARE_PROVIDER_SITE_OTHER): Payer: BC Managed Care – PPO | Admitting: Family Medicine

## 2020-01-19 VITALS — BP 122/80 | HR 75 | Temp 98.1°F | Ht 62.0 in | Wt 140.0 lb

## 2020-01-19 DIAGNOSIS — G4700 Insomnia, unspecified: Secondary | ICD-10-CM

## 2020-01-19 DIAGNOSIS — E785 Hyperlipidemia, unspecified: Secondary | ICD-10-CM

## 2020-01-19 DIAGNOSIS — Z1159 Encounter for screening for other viral diseases: Secondary | ICD-10-CM | POA: Diagnosis not present

## 2020-01-19 DIAGNOSIS — F439 Reaction to severe stress, unspecified: Secondary | ICD-10-CM

## 2020-01-19 DIAGNOSIS — Z114 Encounter for screening for human immunodeficiency virus [HIV]: Secondary | ICD-10-CM | POA: Diagnosis not present

## 2020-01-19 MED ORDER — ZOLPIDEM TARTRATE 5 MG PO TABS: 5.0000 mg | ORAL_TABLET | Freq: Every evening | ORAL | 1 refills | Status: DC | PRN

## 2020-01-19 NOTE — Progress Notes (Signed)
Subjective:  Patient ID: Sarah Wilkinson, female    DOB: 28-Jul-1965  Age: 54 y.o. MRN: [id number]  CC:  Chief Complaint  Patient presents with  . Follow-up    on hyperlidemia. Pt reports no physical symptoms of this condition. Pt is currently fasting.    HPI LETOYA STALLONE presents for   Hyperlipidemia: No current statin. Low ascvd risk score.  Takes cod liver oil ,MVI.  FH CAD: father in late 3's, and blocked carotid arteries as well.  Brother with leaky valve? Fast heartrate, pacemaker possible.   The 10-year ASCVD risk score Mikey Bussing DC Brooke Bonito., et al., 2013) is: 1.4%   Values used to calculate the score:     Age: 65 years     Sex: Female     Is Non-Hispanic African American: No     Diabetic: No     Tobacco smoker: No     Systolic Blood Pressure: 865 mmHg     Is BP treated: No     HDL Cholesterol: 93 mg/dL     Total Cholesterol: 272 mg/dL   Lab Results  Component Value Date   CHOL 272 (H) 01/19/2020   HDL 93 01/19/2020   LDLCALC 169 (H) 01/19/2020   TRIG 66 01/19/2020   CHOLHDL 2.9 01/19/2020   Lab Results  Component Value Date   ALT 27 01/19/2020   AST 23 01/19/2020   ALKPHOS 101 01/19/2020   BILITOT 0.4 01/19/2020   Prior tetanus vaccine caused egg sized swelling. Bruising, itching  - lasted one month. Deferred repeat today.     Situational stressors,  Parents had Covid, father passed in November last year, stress with mom.  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  Some trouble with sleep.  OBGYN - [facility], has taken Ambien as needed if late at night and unable to sleep.  Taking melatonin.  Benadryl at times.  Exercise: physically active, but no regular exercise. carrying babies/grandkids. Started adding walk.   Controlled substance database (PDMP) reviewed. No concerns appreciated.  ambien last filled #30 03/17/19.  Scheduling time to herself.   Trying baths, deep breaths, massage, meditation to help  with stress. Avoiding afternoon caffeine.  Grief with dad's passing.  Depression screen [facility] 2/9 01/19/2020 12/26/2018 02/14/2016  Decreased Interest 0 0 0  Down, Depressed, Hopeless 0 0 0  PHQ - 2 Score 0 0 0    History Patient Active Problem List   Diagnosis Date Noted  . HLD (hyperlipidemia) 12/26/2018  . Headache 12/26/2018  . Subluxation complex of thoracic region 12/26/2018   No past medical history on file. Past Surgical History:  Procedure Laterality Date  . VEIN SURGERY  1999   Allergies  Allergen Reactions  . Sulfa Antibiotics   . Tetanus Toxoids     1 month of large swollen area on arm. Advised to hold off with further td/tdap.    Prior to Admission medications   Medication Sig Start Date End Date Taking? Authorizing Provider  Cod Liver Oil CAPS Take by mouth.   Yes [provider]  Melatonin-Pyridoxine (MELATIN PO) Take by mouth.   Yes [provider]  Multiple Vitamin (MULTIVITAMIN) capsule Take 1 capsule by mouth daily.   Yes [provider]  zolpidem (AMBIEN) 5 MG tablet Take 5 mg by mouth at bedtime as needed for sleep.   Yes [provider]   Social History   Socioeconomic History  . Marital status: Married  Spouse name: Not on file  . Number of children: Not on file  . Years of education: Not on file  . Highest education level: Not on file  Occupational History  . Not on file  Tobacco Use  . Smoking status: Never Smoker  . Smokeless tobacco: Never Used  Substance and Sexual Activity  . Alcohol use: Yes    Comment: occ  . Drug use: Never  . Sexual activity: Yes  Other Topics Concern  . Not on file  Social History Narrative  . Not on file   Social Determinants of Health   Financial Resource Strain:   . Difficulty of Paying Living Expenses: Not on file  Food Insecurity:   . Worried About Charity fundraiser in the Last Year: Not on file  . Ran Out of Food in the Last Year: Not on file  Transportation  Needs:   . Lack of Transportation (Medical): Not on file  . Lack of Transportation (Non-Medical): Not on file  Physical Activity:   . Days of Exercise per Week: Not on file  . Minutes of Exercise per Session: Not on file  Stress:   . Feeling of Stress : Not on file  Social Connections:   . Frequency of Communication with Friends and Family: Not on file  . Frequency of Social Gatherings with Friends and Family: Not on file  . Attends Religious Services: Not on file  . Active Member of Clubs or Organizations: Not on file  . Attends Archivist Meetings: Not on file  . Marital Status: Not on file  Intimate Partner Violence:   . Fear of Current or Ex-Partner: Not on file  . Emotionally Abused: Not on file  . Physically Abused: Not on file  . Sexually Abused: Not on file    Review of Systems  Respiratory: Negative for chest tightness and shortness of breath.   Cardiovascular: Negative for chest pain, palpitations and leg swelling.  Gastrointestinal: Negative for abdominal pain and blood in stool.  Neurological: Positive for headaches (stress/weather migraine at times. ). Negative for dizziness, syncope and light-headedness.  Psychiatric/Behavioral: Positive for sleep disturbance.     Objective:   Vitals:   01/19/20 1429  BP: 122/80  Pulse: 75  Temp: 98.1 F (36.7 C)  TempSrc: Temporal  SpO2: 99%  Weight: 140 lb (63.5 kg)  Height: 5' 2"  (1.575 m)     Physical Exam Vitals reviewed.  Constitutional:      Appearance: She is well-developed.  HENT:     Head: Normocephalic and atraumatic.  Eyes:     Conjunctiva/sclera: Conjunctivae normal.     Pupils: Pupils are equal, round, and reactive to light.  Neck:     Vascular: No carotid bruit.  Cardiovascular:     Rate and Rhythm: Normal rate and regular rhythm.     Heart sounds: Normal heart sounds.  Pulmonary:     Effort: Pulmonary effort is normal.     Breath sounds: Normal breath sounds.  Abdominal:      Palpations: Abdomen is soft. There is no pulsatile mass.     Tenderness: There is no abdominal tenderness.  Skin:    General: Skin is warm and dry.  Neurological:     Mental Status: She is alert and oriented to person, place, and time.  Psychiatric:        Mood and Affect: Mood normal.        Behavior: Behavior normal.  Thought Content: Thought content normal.        Judgment: Judgment normal.      Assessment & Plan:  CHESNI VOS is a 54 y.o. female . Hyperlipidemia, unspecified hyperlipidemia type - Plan: Lipid panel, Comprehensive metabolic panel  -Previous low ASCVD risk score, but does have family history of cardiac disease.  Repeat lipids, consider coronary calcium scoring to further decide on statin use.  Need for hepatitis C screening test - Plan: Hepatitis C antibody  Screening for HIV without presence of risk factors - Plan: HIV antibody (with reflex)  Insomnia, unspecified type - Plan: zolpidem (AMBIEN) 5 MG tablet Situational stress  -Situational stressors with death in family, other stress as above.  Stress management discussed, insomnia discussed, handouts given.  Short-term Ambien prescribed as infrequent use previously.  RTC precautions given.  Meds ordered this encounter  Medications  . zolpidem (AMBIEN) 5 MG tablet    Sig: Take 1 tablet (5 mg total) by mouth at bedtime as needed for sleep.    Dispense:  15 tablet    Refill:  1   Patient Instructions   Continue to invest in time for yourself, time to recharge.  Continue exercise in some way most days of the week.  See info on sleep and stress below. ambien temporarily refilled if needed.  Depending on cholesterol reading, can consider statin or coronary calcium scoring for more information.   Insomnia Insomnia is a sleep disorder that makes it difficult to fall asleep or stay asleep. Insomnia can cause fatigue, low energy, difficulty concentrating, mood swings, and poor performance at work or  school. There are three different ways to classify insomnia:  Difficulty falling asleep.  Difficulty staying asleep.  Waking up too early in the morning. Any type of insomnia can be long-term (chronic) or short-term (acute). Both are common. Short-term insomnia usually lasts for three months or less. Chronic insomnia occurs at least three times a week for longer than three months. What are the causes? Insomnia may be caused by another condition, situation, or substance, such as:  Anxiety.  Certain medicines.  Gastroesophageal reflux disease (GERD) or other gastrointestinal conditions.  Asthma or other breathing conditions.  Restless legs syndrome, sleep apnea, or other sleep disorders.  Chronic pain.  Menopause.  Stroke.  Abuse of alcohol, tobacco, or illegal drugs.  Mental health conditions, such as depression.  Caffeine.  Neurological disorders, such as Alzheimer's disease.  An overactive thyroid (hyperthyroidism). Sometimes, the cause of insomnia may not be known. What increases the risk? Risk factors for insomnia include:  Gender. Women are affected more often than men.  Age. Insomnia is more common as you get older.  Stress.  Lack of exercise.  Irregular work schedule or working night shifts.  Traveling between different time zones.  Certain medical and mental health conditions. What are the signs or symptoms? If you have insomnia, the main symptom is having trouble falling asleep or having trouble staying asleep. This may lead to other symptoms, such as:  Feeling fatigued or having low energy.  Feeling nervous about going to sleep.  Not feeling rested in the morning.  Having trouble concentrating.  Feeling irritable, anxious, or depressed. How is this diagnosed? This condition may be diagnosed based on:  Your symptoms and medical history. Your health care provider may ask about: ? Your sleep habits. ? Any medical conditions you  have. ? Your mental health.  A physical exam. How is this treated? Treatment for insomnia depends on the  cause. Treatment may focus on treating an underlying condition that is causing insomnia. Treatment may also include:  Medicines to help you sleep.  Counseling or therapy.  Lifestyle adjustments to help you sleep better. Follow these instructions at home: Eating and drinking   Limit or avoid alcohol, caffeinated beverages, and cigarettes, especially close to bedtime. These can disrupt your sleep.  Do not eat a large meal or eat spicy foods right before bedtime. This can lead to digestive discomfort that can make it hard for you to sleep. Sleep habits   Keep a sleep diary to help you and your health care provider figure out what could be causing your insomnia. Write down: ? When you sleep. ? When you wake up during the night. ? How well you sleep. ? How rested you feel the next day. ? Any side effects of medicines you are taking. ? What you eat and drink.  Make your bedroom a dark, comfortable place where it is easy to fall asleep. ? Put up shades or blackout curtains to block light from outside. ? Use a white noise machine to block noise. ? Keep the temperature cool.  Limit screen use before bedtime. This includes: ? Watching TV. ? Using your smartphone, tablet, or computer.  Stick to a routine that includes going to bed and waking up at the same times every day and night. This can help you fall asleep faster. Consider making a quiet activity, such as reading, part of your nighttime routine.  Try to avoid taking naps during the day so that you sleep better at night.  Get out of bed if you are still awake after 15 minutes of trying to sleep. Keep the lights down, but try reading or doing a quiet activity. When you feel sleepy, go back to bed. General instructions  Take over-the-counter and prescription medicines only as told by your health care provider.  Exercise  regularly, as told by your health care provider. Avoid exercise starting several hours before bedtime.  Use relaxation techniques to manage stress. Ask your health care provider to suggest some techniques that may work well for you. These may include: ? Breathing exercises. ? Routines to release muscle tension. ? Visualizing peaceful scenes.  Make sure that you drive carefully. Avoid driving if you feel very sleepy.  Keep all follow-up visits as told by your health care provider. This is important. Contact a health care provider if:  You are tired throughout the day.  You have trouble in your daily routine due to sleepiness.  You continue to have sleep problems, or your sleep problems get worse. Get help right away if:  You have serious thoughts about hurting yourself or someone else. If you ever feel like you may hurt yourself or others, or have thoughts about taking your own life, get help right away. You can go to your nearest emergency department or call:  Your local emergency services (911 in the [place]  A suicide crisis helpline, such as the [place] at 401-397-6712. This is open 24 hours a day. Summary  Insomnia is a sleep disorder that makes it difficult to fall asleep or stay asleep.  Insomnia can be long-term (chronic) or short-term (acute).  Treatment for insomnia depends on the cause. Treatment may focus on treating an underlying condition that is causing insomnia.  Keep a sleep diary to help you and your health care provider figure out what could be causing your insomnia. This information is not intended to  replace advice given to you by your health care provider. Make sure you discuss any questions you have with your health care provider. Document Revised: 02/09/2017 Document Reviewed: 12/07/2016 Elsevier Patient Education  2020 [place], Adult Stress is a normal reaction to life events. Stress is what you feel when  life demands more than you are used to, or more than you think you can handle. Some stress can be useful, such as studying for a test or meeting a deadline at work. Stress that occurs too often or for too long can cause problems. It can affect your emotional health and interfere with relationships and normal daily activities. Too much stress can weaken your body's defense system (immune system) and increase your risk for physical illness. If you already have a medical problem, stress can make it worse. What are the causes? All sorts of life events can cause stress. An event that causes stress for one person may not be stressful for another person. Major life events, whether positive or negative, commonly cause stress. Examples include:  Losing a job or starting a new job.  Losing a loved one.  Moving to a new town or home.  Getting married or divorced.  Having a baby.  Getting injured or sick. Less obvious life events can also cause stress, especially if they occur day after day or in combination with each other. Examples include:  Working long hours.  Driving in traffic.  Caring for children.  Being in debt.  Being in a difficult relationship. What are the signs or symptoms? Stress can cause emotional symptoms, including:  Anxiety. This is feeling worried, afraid, on edge, overwhelmed, or out of control.  Anger, including irritation or impatience.  Depression. This is feeling sad, down, helpless, or guilty.  Trouble focusing, remembering, or making decisions. Stress can cause physical symptoms, including:  Aches and pains. These may affect your head, neck, back, stomach, or other areas of your body.  Tight muscles or a clenched jaw.  Low energy.  Trouble sleeping. Stress can cause unhealthy behaviors, including:  Eating to feel better (overeating) or skipping meals.  Working too much or putting off tasks.  Smoking, drinking alcohol, or using drugs to feel  better. How is this diagnosed? Stress is diagnosed through an assessment by your health care provider. He or she may diagnose this condition based on:  Your symptoms and any stressful life events.  Your medical history.  Tests to rule out other causes of your symptoms. Depending on your condition, your health care provider may refer you to a specialist for further evaluation. How is this treated?  Stress management techniques are the recommended treatment for stress. Medicine is not typically recommended for the treatment of stress. Techniques to reduce your reaction to stressful life events include:  Stress identification. Monitor yourself for symptoms of stress and identify what causes stress for you. These skills may help you to avoid or prepare for stressful events.  Time management. Set your priorities, keep a calendar of events, and learn to say no. Taking these actions can help you avoid making too many commitments. Techniques for coping with stress include:  Rethinking the problem. Try to think realistically about stressful events rather than ignoring them or overreacting. Try to find the positives in a stressful situation rather than focusing on the negatives.  Exercise. Physical exercise can release both physical and emotional tension. The key is to find a form of exercise that you enjoy and do it  regularly.  Relaxation techniques. These relax the body and mind. The key is to find one or more that you enjoy and use the techniques regularly. Examples include: ? Meditation, deep breathing, or progressive relaxation techniques. ? Yoga or tai chi. ? Biofeedback, mindfulness techniques, or journaling. ? Listening to music, being out in nature, or participating in other hobbies.  Practicing a healthy lifestyle. Eat a balanced diet, drink plenty of water, limit or avoid caffeine, and get plenty of sleep.  Having a strong support network. Spend time with family, friends, or other  people you enjoy being around. Express your feelings and talk things over with someone you trust. Counseling or talk therapy with a mental health professional may be helpful if you are having trouble managing stress on your own. Follow these instructions at home: Lifestyle   Avoid drugs.  Do not use any products that contain nicotine or tobacco, such as cigarettes, e-cigarettes, and chewing tobacco. If you need help quitting, ask your health care provider.  Limit alcohol intake to no more than 1 drink a day for nonpregnant women and 2 drinks a day for men. One drink equals 12 oz of beer, 5 oz of wine, or 1 oz of hard liquor  Do not use alcohol or drugs to relax.  Eat a balanced diet that includes fresh fruits and vegetables, whole grains, lean meats, fish, eggs, and beans, and low-fat dairy. Avoid processed foods and foods high in added fat, sugar, and salt.  Exercise at least 30 minutes on 5 or more days each week.  Get 7-8 hours of sleep each night. General instructions   Practice stress management techniques as discussed with your health care provider.  Drink enough fluid to keep your urine clear or pale yellow.  Take over-the-counter and prescription medicines only as told by your health care provider.  Keep all follow-up visits as told by your health care provider. This is important. Contact a health care provider if:  Your symptoms get worse.  You have new symptoms.  You feel overwhelmed by your problems and can no longer manage them on your own. Get help right away if:  You have thoughts of hurting yourself or others. If you ever feel like you may hurt yourself or others, or have thoughts about taking your own life, get help right away. You can go to your nearest emergency department or call:  Your local emergency services (911 in the [place]  A suicide crisis helpline, such as the [place] at 505-787-8249. This is open 24 hours a  day. Summary  Stress is a normal reaction to life events. It can cause problems if it happens too often or for too long.  Practicing stress management techniques is the best way to treat stress.  Counseling or talk therapy with a mental health professional may be helpful if you are having trouble managing stress on your own. This information is not intended to replace advice given to you by your health care provider. Make sure you discuss any questions you have with your health care provider. Document Revised: 09/27/2018 Document Reviewed: 04/19/2016 Elsevier Patient Education  [place].  Insomnia Insomnia is a sleep disorder that makes it difficult to fall asleep or stay asleep. Insomnia can cause fatigue, low energy, difficulty concentrating, mood swings, and poor performance at work or school. There are three different ways to classify insomnia:  Difficulty falling asleep.  Difficulty staying asleep.  Waking up too early in the morning. Any  type of insomnia can be long-term (chronic) or short-term (acute). Both are common. Short-term insomnia usually lasts for three months or less. Chronic insomnia occurs at least three times a week for longer than three months. What are the causes? Insomnia may be caused by another condition, situation, or substance, such as:  Anxiety.  Certain medicines.  Gastroesophageal reflux disease (GERD) or other gastrointestinal conditions.  Asthma or other breathing conditions.  Restless legs syndrome, sleep apnea, or other sleep disorders.  Chronic pain.  Menopause.  Stroke.  Abuse of alcohol, tobacco, or illegal drugs.  Mental health conditions, such as depression.  Caffeine.  Neurological disorders, such as Alzheimer's disease.  An overactive thyroid (hyperthyroidism). Sometimes, the cause of insomnia may not be known. What increases the risk? Risk factors for insomnia include:  Gender. Women are affected more often than  men.  Age. Insomnia is more common as you get older.  Stress.  Lack of exercise.  Irregular work schedule or working night shifts.  Traveling between different time zones.  Certain medical and mental health conditions. What are the signs or symptoms? If you have insomnia, the main symptom is having trouble falling asleep or having trouble staying asleep. This may lead to other symptoms, such as:  Feeling fatigued or having low energy.  Feeling nervous about going to sleep.  Not feeling rested in the morning.  Having trouble concentrating.  Feeling irritable, anxious, or depressed. How is this diagnosed? This condition may be diagnosed based on:  Your symptoms and medical history. Your health care provider may ask about: ? Your sleep habits. ? Any medical conditions you have. ? Your mental health.  A physical exam. How is this treated? Treatment for insomnia depends on the cause. Treatment may focus on treating an underlying condition that is causing insomnia. Treatment may also include:  Medicines to help you sleep.  Counseling or therapy.  Lifestyle adjustments to help you sleep better. Follow these instructions at home: Eating and drinking   Limit or avoid alcohol, caffeinated beverages, and cigarettes, especially close to bedtime. These can disrupt your sleep.  Do not eat a large meal or eat spicy foods right before bedtime. This can lead to digestive discomfort that can make it hard for you to sleep. Sleep habits   Keep a sleep diary to help you and your health care provider figure out what could be causing your insomnia. Write down: ? When you sleep. ? When you wake up during the night. ? How well you sleep. ? How rested you feel the next day. ? Any side effects of medicines you are taking. ? What you eat and drink.  Make your bedroom a dark, comfortable place where it is easy to fall asleep. ? Put up shades or blackout curtains to block light from  outside. ? Use a white noise machine to block noise. ? Keep the temperature cool.  Limit screen use before bedtime. This includes: ? Watching TV. ? Using your smartphone, tablet, or computer.  Stick to a routine that includes going to bed and waking up at the same times every day and night. This can help you fall asleep faster. Consider making a quiet activity, such as reading, part of your nighttime routine.  Try to avoid taking naps during the day so that you sleep better at night.  Get out of bed if you are still awake after 15 minutes of trying to sleep. Keep the lights down, but try reading or doing a quiet activity.  When you feel sleepy, go back to bed. General instructions  Take over-the-counter and prescription medicines only as told by your health care provider.  Exercise regularly, as told by your health care provider. Avoid exercise starting several hours before bedtime.  Use relaxation techniques to manage stress. Ask your health care provider to suggest some techniques that may work well for you. These may include: ? Breathing exercises. ? Routines to release muscle tension. ? Visualizing peaceful scenes.  Make sure that you drive carefully. Avoid driving if you feel very sleepy.  Keep all follow-up visits as told by your health care provider. This is important. Contact a health care provider if:  You are tired throughout the day.  You have trouble in your daily routine due to sleepiness.  You continue to have sleep problems, or your sleep problems get worse. Get help right away if:  You have serious thoughts about hurting yourself or someone else. If you ever feel like you may hurt yourself or others, or have thoughts about taking your own life, get help right away. You can go to your nearest emergency department or call:  Your local emergency services (911 in the [place]  A suicide crisis helpline, such as the [place] at  (912)343-3131. This is open 24 hours a day. Summary  Insomnia is a sleep disorder that makes it difficult to fall asleep or stay asleep.  Insomnia can be long-term (chronic) or short-term (acute).  Treatment for insomnia depends on the cause. Treatment may focus on treating an underlying condition that is causing insomnia.  Keep a sleep diary to help you and your health care provider figure out what could be causing your insomnia. This information is not intended to replace advice given to you by your health care provider. Make sure you discuss any questions you have with your health care provider. Document Revised: 02/09/2017 Document Reviewed: 12/07/2016 Elsevier Patient Education  [employer].   If you have lab work done today you will be contacted with your lab results within the next 2 weeks.  If you have not heard from [place] then please contact us. The fastest way to get your results is to register for My Chart.   IF you received an x-ray today, you will receive an invoice from [facility]. Please contact [facility] at [phone] with questions or concerns regarding your invoice.   IF you received labwork today, you will receive an invoice from [place]. Please contact [facility] at [phone] with questions or concerns regarding your invoice.   Our billing staff will not be able to assist you with questions regarding bills from these companies.  You will be contacted with the lab results as soon as they are available. The fastest way to get your results is to activate your My Chart account. Instructions are located on the last page of this paperwork. If you have not heard from [place] regarding the results in 2 weeks, please contact this office.         Signed, Merri Ray, MD [facility]

## 2020-01-19 NOTE — Patient Instructions (Addendum)
Continue to invest in time for yourself, time to recharge.  Continue exercise in some way most days of the week.  See info on sleep and stress below. ambien temporarily refilled if needed.  Depending on cholesterol reading, can consider statin or coronary calcium scoring for more information.   Insomnia Insomnia is a sleep disorder that makes it difficult to fall asleep or stay asleep. Insomnia can cause fatigue, low energy, difficulty concentrating, mood swings, and poor performance at work or school. There are three different ways to classify insomnia:  Difficulty falling asleep.  Difficulty staying asleep.  Waking up too early in the morning. Any type of insomnia can be long-term (chronic) or short-term (acute). Both are common. Short-term insomnia usually lasts for three months or less. Chronic insomnia occurs at least three times a week for longer than three months. What are the causes? Insomnia may be caused by another condition, situation, or substance, such as:  Anxiety.  Certain medicines.  Gastroesophageal reflux disease (GERD) or other gastrointestinal conditions.  Asthma or other breathing conditions.  Restless legs syndrome, sleep apnea, or other sleep disorders.  Chronic pain.  Menopause.  Stroke.  Abuse of alcohol, tobacco, or illegal drugs.  Mental health conditions, such as depression.  Caffeine.  Neurological disorders, such as Alzheimer's disease.  An overactive thyroid (hyperthyroidism). Sometimes, the cause of insomnia may not be known. What increases the risk? Risk factors for insomnia include:  Gender. Women are affected more often than men.  Age. Insomnia is more common as you get older.  Stress.  Lack of exercise.  Irregular work schedule or working night shifts.  Traveling between different time zones.  Certain medical and mental health conditions. What are the signs or symptoms? If you have insomnia, the main symptom is having  trouble falling asleep or having trouble staying asleep. This may lead to other symptoms, such as:  Feeling fatigued or having low energy.  Feeling nervous about going to sleep.  Not feeling rested in the morning.  Having trouble concentrating.  Feeling irritable, anxious, or depressed. How is this diagnosed? This condition may be diagnosed based on:  Your symptoms and medical history. Your health care provider may ask about: ? Your sleep habits. ? Any medical conditions you have. ? Your mental health.  A physical exam. How is this treated? Treatment for insomnia depends on the cause. Treatment may focus on treating an underlying condition that is causing insomnia. Treatment may also include:  Medicines to help you sleep.  Counseling or therapy.  Lifestyle adjustments to help you sleep better. Follow these instructions at home: Eating and drinking   Limit or avoid alcohol, caffeinated beverages, and cigarettes, especially close to bedtime. These can disrupt your sleep.  Do not eat a large meal or eat spicy foods right before bedtime. This can lead to digestive discomfort that can make it hard for you to sleep. Sleep habits   Keep a sleep diary to help you and your health care provider figure out what could be causing your insomnia. Write down: ? When you sleep. ? When you wake up during the night. ? How well you sleep. ? How rested you feel the next day. ? Any side effects of medicines you are taking. ? What you eat and drink.  Make your bedroom a dark, comfortable place where it is easy to fall asleep. ? Put up shades or blackout curtains to block light from outside. ? Use a white noise machine to block noise. ?  Keep the temperature cool.  Limit screen use before bedtime. This includes: ? Watching TV. ? Using your smartphone, tablet, or computer.  Stick to a routine that includes going to bed and waking up at the same times every day and night. This can help  you fall asleep faster. Consider making a quiet activity, such as reading, part of your nighttime routine.  Try to avoid taking naps during the day so that you sleep better at night.  Get out of bed if you are still awake after 15 minutes of trying to sleep. Keep the lights down, but try reading or doing a quiet activity. When you feel sleepy, go back to bed. General instructions  Take over-the-counter and prescription medicines only as told by your health care provider.  Exercise regularly, as told by your health care provider. Avoid exercise starting several hours before bedtime.  Use relaxation techniques to manage stress. Ask your health care provider to suggest some techniques that may work well for you. These may include: ? Breathing exercises. ? Routines to release muscle tension. ? Visualizing peaceful scenes.  Make sure that you drive carefully. Avoid driving if you feel very sleepy.  Keep all follow-up visits as told by your health care provider. This is important. Contact a health care provider if:  You are tired throughout the day.  You have trouble in your daily routine due to sleepiness.  You continue to have sleep problems, or your sleep problems get worse. Get help right away if:  You have serious thoughts about hurting yourself or someone else. If you ever feel like you may hurt yourself or others, or have thoughts about taking your own life, get help right away. You can go to your nearest emergency department or call:  Your local emergency services (911 in the [place]  A suicide crisis helpline, such as the [place] at 2291542829. This is open 24 hours a day. Summary  Insomnia is a sleep disorder that makes it difficult to fall asleep or stay asleep.  Insomnia can be long-term (chronic) or short-term (acute).  Treatment for insomnia depends on the cause. Treatment may focus on treating an underlying condition that is causing  insomnia.  Keep a sleep diary to help you and your health care provider figure out what could be causing your insomnia. This information is not intended to replace advice given to you by your health care provider. Make sure you discuss any questions you have with your health care provider. Document Revised: 02/09/2017 Document Reviewed: 12/07/2016 Elsevier Patient Education  2020 [place], Adult Stress is a normal reaction to life events. Stress is what you feel when life demands more than you are used to, or more than you think you can handle. Some stress can be useful, such as studying for a test or meeting a deadline at work. Stress that occurs too often or for too long can cause problems. It can affect your emotional health and interfere with relationships and normal daily activities. Too much stress can weaken your body's defense system (immune system) and increase your risk for physical illness. If you already have a medical problem, stress can make it worse. What are the causes? All sorts of life events can cause stress. An event that causes stress for one person may not be stressful for another person. Major life events, whether positive or negative, commonly cause stress. Examples include:  Losing a job or starting a new job.  Losing  a loved one.  Moving to a new town or home.  Getting married or divorced.  Having a baby.  Getting injured or sick. Less obvious life events can also cause stress, especially if they occur day after day or in combination with each other. Examples include:  Working long hours.  Driving in traffic.  Caring for children.  Being in debt.  Being in a difficult relationship. What are the signs or symptoms? Stress can cause emotional symptoms, including:  Anxiety. This is feeling worried, afraid, on edge, overwhelmed, or out of control.  Anger, including irritation or impatience.  Depression. This is feeling sad, down, helpless,  or guilty.  Trouble focusing, remembering, or making decisions. Stress can cause physical symptoms, including:  Aches and pains. These may affect your head, neck, back, stomach, or other areas of your body.  Tight muscles or a clenched jaw.  Low energy.  Trouble sleeping. Stress can cause unhealthy behaviors, including:  Eating to feel better (overeating) or skipping meals.  Working too much or putting off tasks.  Smoking, drinking alcohol, or using drugs to feel better. How is this diagnosed? Stress is diagnosed through an assessment by your health care provider. He or she may diagnose this condition based on:  Your symptoms and any stressful life events.  Your medical history.  Tests to rule out other causes of your symptoms. Depending on your condition, your health care provider may refer you to a specialist for further evaluation. How is this treated?  Stress management techniques are the recommended treatment for stress. Medicine is not typically recommended for the treatment of stress. Techniques to reduce your reaction to stressful life events include:  Stress identification. Monitor yourself for symptoms of stress and identify what causes stress for you. These skills may help you to avoid or prepare for stressful events.  Time management. Set your priorities, keep a calendar of events, and learn to say no. Taking these actions can help you avoid making too many commitments. Techniques for coping with stress include:  Rethinking the problem. Try to think realistically about stressful events rather than ignoring them or overreacting. Try to find the positives in a stressful situation rather than focusing on the negatives.  Exercise. Physical exercise can release both physical and emotional tension. The key is to find a form of exercise that you enjoy and do it regularly.  Relaxation techniques. These relax the body and mind. The key is to find one or more that you  enjoy and use the techniques regularly. Examples include: ? Meditation, deep breathing, or progressive relaxation techniques. ? Yoga or tai chi. ? Biofeedback, mindfulness techniques, or journaling. ? Listening to music, being out in nature, or participating in other hobbies.  Practicing a healthy lifestyle. Eat a balanced diet, drink plenty of water, limit or avoid caffeine, and get plenty of sleep.  Having a strong support network. Spend time with family, friends, or other people you enjoy being around. Express your feelings and talk things over with someone you trust. Counseling or talk therapy with a mental health professional may be helpful if you are having trouble managing stress on your own. Follow these instructions at home: Lifestyle   Avoid drugs.  Do not use any products that contain nicotine or tobacco, such as cigarettes, e-cigarettes, and chewing tobacco. If you need help quitting, ask your health care provider.  Limit alcohol intake to no more than 1 drink a day for nonpregnant women and 2 drinks a day for men.  One drink equals 12 oz of beer, 5 oz of wine, or 1 oz of hard liquor  Do not use alcohol or drugs to relax.  Eat a balanced diet that includes fresh fruits and vegetables, whole grains, lean meats, fish, eggs, and beans, and low-fat dairy. Avoid processed foods and foods high in added fat, sugar, and salt.  Exercise at least 30 minutes on 5 or more days each week.  Get 7-8 hours of sleep each night. General instructions   Practice stress management techniques as discussed with your health care provider.  Drink enough fluid to keep your urine clear or pale yellow.  Take over-the-counter and prescription medicines only as told by your health care provider.  Keep all follow-up visits as told by your health care provider. This is important. Contact a health care provider if:  Your symptoms get worse.  You have new symptoms.  You feel overwhelmed by your  problems and can no longer manage them on your own. Get help right away if:  You have thoughts of hurting yourself or others. If you ever feel like you may hurt yourself or others, or have thoughts about taking your own life, get help right away. You can go to your nearest emergency department or call:  Your local emergency services (911 in the [place]  A suicide crisis helpline, such as the [place] at 947 236 8498. This is open 24 hours a day. Summary  Stress is a normal reaction to life events. It can cause problems if it happens too often or for too long.  Practicing stress management techniques is the best way to treat stress.  Counseling or talk therapy with a mental health professional may be helpful if you are having trouble managing stress on your own. This information is not intended to replace advice given to you by your health care provider. Make sure you discuss any questions you have with your health care provider. Document Revised: 09/27/2018 Document Reviewed: 04/19/2016 Elsevier Patient Education  [employer].  Insomnia Insomnia is a sleep disorder that makes it difficult to fall asleep or stay asleep. Insomnia can cause fatigue, low energy, difficulty concentrating, mood swings, and poor performance at work or school. There are three different ways to classify insomnia:  Difficulty falling asleep.  Difficulty staying asleep.  Waking up too early in the morning. Any type of insomnia can be long-term (chronic) or short-term (acute). Both are common. Short-term insomnia usually lasts for three months or less. Chronic insomnia occurs at least three times a week for longer than three months. What are the causes? Insomnia may be caused by another condition, situation, or substance, such as:  Anxiety.  Certain medicines.  Gastroesophageal reflux disease (GERD) or other gastrointestinal conditions.  Asthma or other breathing  conditions.  Restless legs syndrome, sleep apnea, or other sleep disorders.  Chronic pain.  Menopause.  Stroke.  Abuse of alcohol, tobacco, or illegal drugs.  Mental health conditions, such as depression.  Caffeine.  Neurological disorders, such as Alzheimer's disease.  An overactive thyroid (hyperthyroidism). Sometimes, the cause of insomnia may not be known. What increases the risk? Risk factors for insomnia include:  Gender. Women are affected more often than men.  Age. Insomnia is more common as you get older.  Stress.  Lack of exercise.  Irregular work schedule or working night shifts.  Traveling between different time zones.  Certain medical and mental health conditions. What are the signs or symptoms? If you have insomnia, the  main symptom is having trouble falling asleep or having trouble staying asleep. This may lead to other symptoms, such as:  Feeling fatigued or having low energy.  Feeling nervous about going to sleep.  Not feeling rested in the morning.  Having trouble concentrating.  Feeling irritable, anxious, or depressed. How is this diagnosed? This condition may be diagnosed based on:  Your symptoms and medical history. Your health care provider may ask about: ? Your sleep habits. ? Any medical conditions you have. ? Your mental health.  A physical exam. How is this treated? Treatment for insomnia depends on the cause. Treatment may focus on treating an underlying condition that is causing insomnia. Treatment may also include:  Medicines to help you sleep.  Counseling or therapy.  Lifestyle adjustments to help you sleep better. Follow these instructions at home: Eating and drinking   Limit or avoid alcohol, caffeinated beverages, and cigarettes, especially close to bedtime. These can disrupt your sleep.  Do not eat a large meal or eat spicy foods right before bedtime. This can lead to digestive discomfort that can make it hard  for you to sleep. Sleep habits   Keep a sleep diary to help you and your health care provider figure out what could be causing your insomnia. Write down: ? When you sleep. ? When you wake up during the night. ? How well you sleep. ? How rested you feel the next day. ? Any side effects of medicines you are taking. ? What you eat and drink.  Make your bedroom a dark, comfortable place where it is easy to fall asleep. ? Put up shades or blackout curtains to block light from outside. ? Use a white noise machine to block noise. ? Keep the temperature cool.  Limit screen use before bedtime. This includes: ? Watching TV. ? Using your smartphone, tablet, or computer.  Stick to a routine that includes going to bed and waking up at the same times every day and night. This can help you fall asleep faster. Consider making a quiet activity, such as reading, part of your nighttime routine.  Try to avoid taking naps during the day so that you sleep better at night.  Get out of bed if you are still awake after 15 minutes of trying to sleep. Keep the lights down, but try reading or doing a quiet activity. When you feel sleepy, go back to bed. General instructions  Take over-the-counter and prescription medicines only as told by your health care provider.  Exercise regularly, as told by your health care provider. Avoid exercise starting several hours before bedtime.  Use relaxation techniques to manage stress. Ask your health care provider to suggest some techniques that may work well for you. These may include: ? Breathing exercises. ? Routines to release muscle tension. ? Visualizing peaceful scenes.  Make sure that you drive carefully. Avoid driving if you feel very sleepy.  Keep all follow-up visits as told by your health care provider. This is important. Contact a health care provider if:  You are tired throughout the day.  You have trouble in your daily routine due to  sleepiness.  You continue to have sleep problems, or your sleep problems get worse. Get help right away if:  You have serious thoughts about hurting yourself or someone else. If you ever feel like you may hurt yourself or others, or have thoughts about taking your own life, get help right away. You can go to your nearest emergency department or  call:  Your local emergency services (911 in the [place]  A suicide crisis helpline, such as the [place] at 956-614-5415. This is open 24 hours a day. Summary  Insomnia is a sleep disorder that makes it difficult to fall asleep or stay asleep.  Insomnia can be long-term (chronic) or short-term (acute).  Treatment for insomnia depends on the cause. Treatment may focus on treating an underlying condition that is causing insomnia.  Keep a sleep diary to help you and your health care provider figure out what could be causing your insomnia. This information is not intended to replace advice given to you by your health care provider. Make sure you discuss any questions you have with your health care provider. Document Revised: 02/09/2017 Document Reviewed: 12/07/2016 Elsevier Patient Education  [employer].   If you have lab work done today you will be contacted with your lab results within the next 2 weeks.  If you have not heard from [place] then please contact us. The fastest way to get your results is to register for My Chart.   IF you received an x-ray today, you will receive an invoice from [facility]. Please contact [facility] at [phone] with questions or concerns regarding your invoice.   IF you received labwork today, you will receive an invoice from [place]. Please contact [facility] at [phone] with questions or concerns regarding your invoice.   Our billing staff will not be able to assist you with questions regarding bills from these companies.  You will be contacted with the  lab results as soon as they are available. The fastest way to get your results is to activate your My Chart account. Instructions are located on the last page of this paperwork. If you have not heard from [place] regarding the results in 2 weeks, please contact this office.

## 2020-01-20 ENCOUNTER — Encounter: Payer: Self-pay | Admitting: Family Medicine

## 2020-01-20 LAB — LIPID PANEL
Chol/HDL Ratio: 2.9 ratio (ref 0.0–4.4)
Cholesterol, Total: 272 mg/dL — ABNORMAL HIGH (ref 100–199)
HDL: 93 mg/dL (ref >39)
LDL Chol Calc (NIH): 169 mg/dL — ABNORMAL HIGH (ref 0–99)
Triglycerides: 66 mg/dL (ref 0–149)
VLDL Cholesterol Cal: 10 mg/dL (ref 5–40)

## 2020-01-20 LAB — COMPREHENSIVE METABOLIC PANEL
ALT: 27 IU/L (ref 0–32)
AST: 23 IU/L (ref 0–40)
Albumin/Globulin Ratio: 1.9 (ref 1.2–2.2)
Albumin: 4.7 g/dL (ref 3.8–4.9)
Alkaline Phosphatase: 101 IU/L (ref 44–121)
BUN/Creatinine Ratio: 23 (ref 9–23)
BUN: 18 mg/dL (ref 6–24)
Bilirubin Total: 0.4 mg/dL (ref 0.0–1.2)
CO2: 25 mmol/L (ref 20–29)
Calcium: 9.4 mg/dL (ref 8.7–10.2)
Chloride: 100 mmol/L (ref 96–106)
Creatinine, Ser: 0.79 mg/dL (ref 0.57–1.00)
GFR calc Af Amer: 98 mL/min/{1.73_m2} (ref >59)
GFR calc non Af Amer: 85 mL/min/{1.73_m2} (ref >59)
Globulin, Total: 2.5 g/dL (ref 1.5–4.5)
Glucose: 81 mg/dL (ref 65–99)
Potassium: 3.9 mmol/L (ref 3.5–5.2)
Sodium: 139 mmol/L (ref 134–144)
Total Protein: 7.2 g/dL (ref 6.0–8.5)

## 2020-01-20 LAB — HEPATITIS C ANTIBODY: Hep C Virus Ab: <0.1 s/co ratio (ref 0.0–0.9)

## 2020-01-20 LAB — HIV ANTIBODY (ROUTINE TESTING W REFLEX): HIV Screen 4th Generation wRfx: NONREACTIVE

## 2020-03-17 IMAGING — MG DIGITAL SCREENING BILATERAL MAMMOGRAM WITH TOMO AND CAD
8 series · 9 of 24 positions shown · non-contrast
Comparison: Previous exam(s).

CLINICAL DATA: Screening.

EXAM:
DIGITAL SCREENING BILATERAL MAMMOGRAM WITH TOMO AND CAD

[R MLO synth-2D]
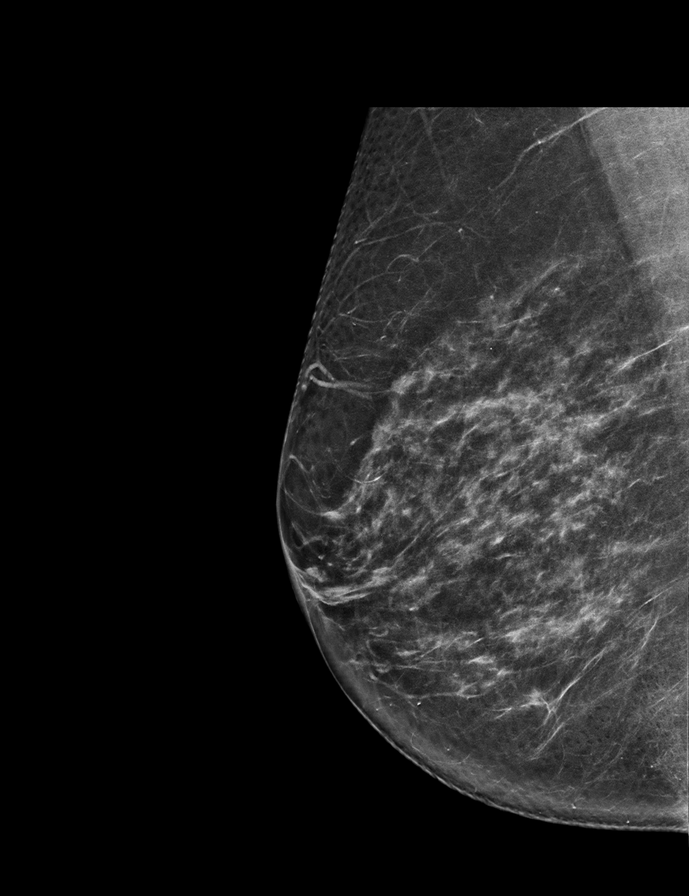

[L MLO synth-2D]
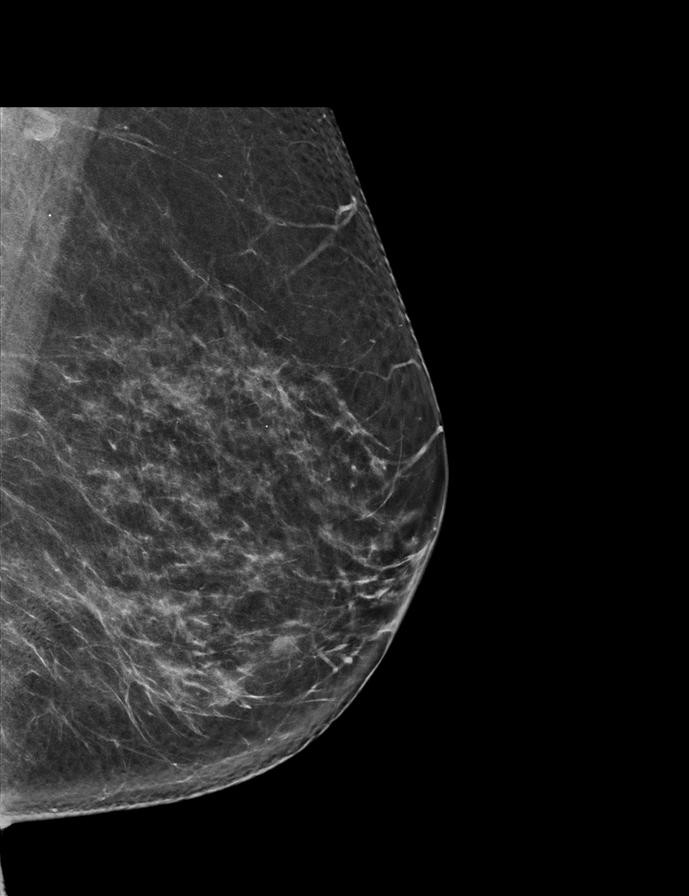

[R CC synth-2D]
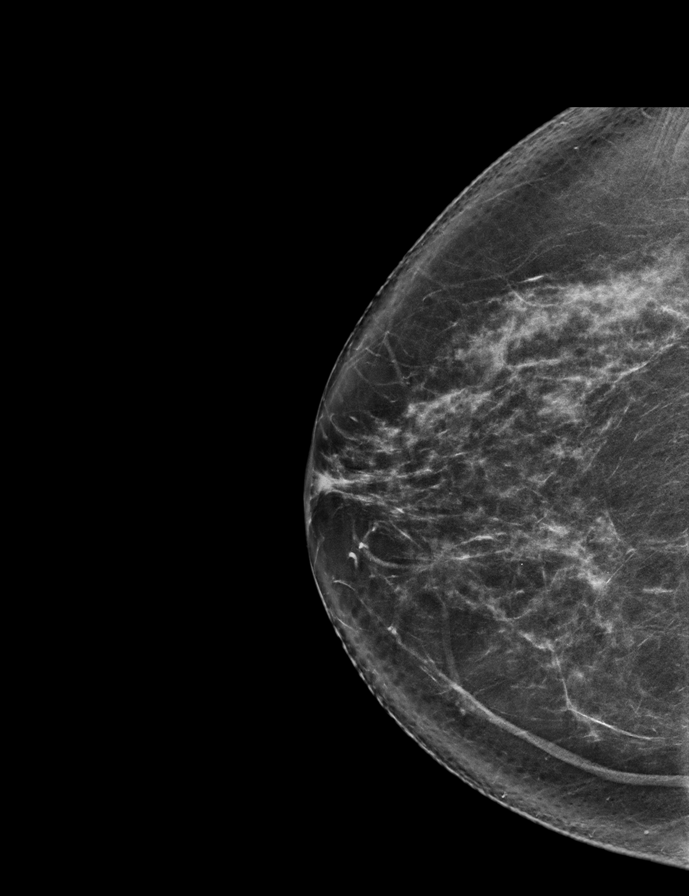

[L CC synth-2D]
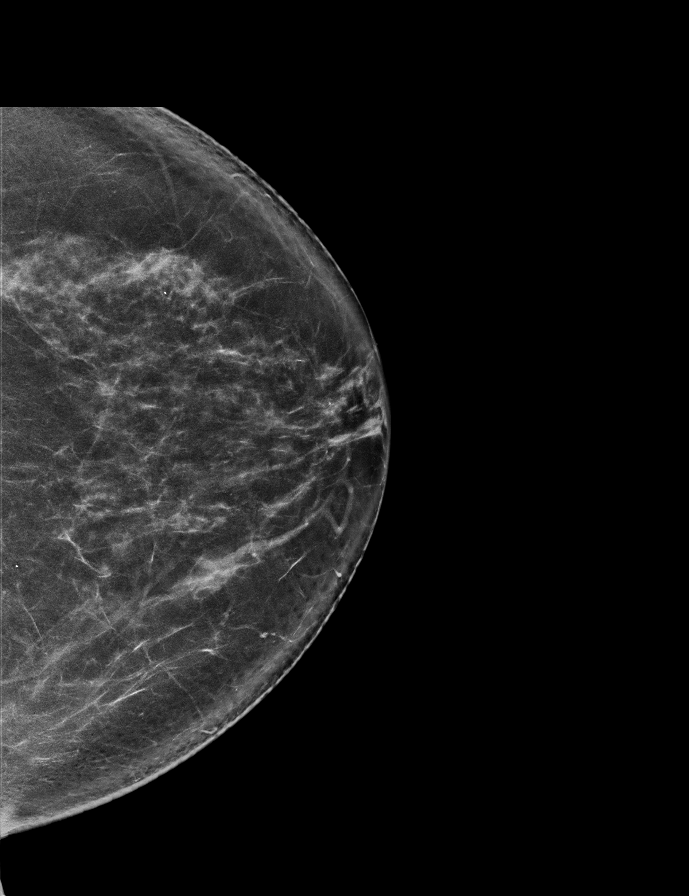

[L MLO tomo · 2 of 75 frames shown]
[frame 25/75]
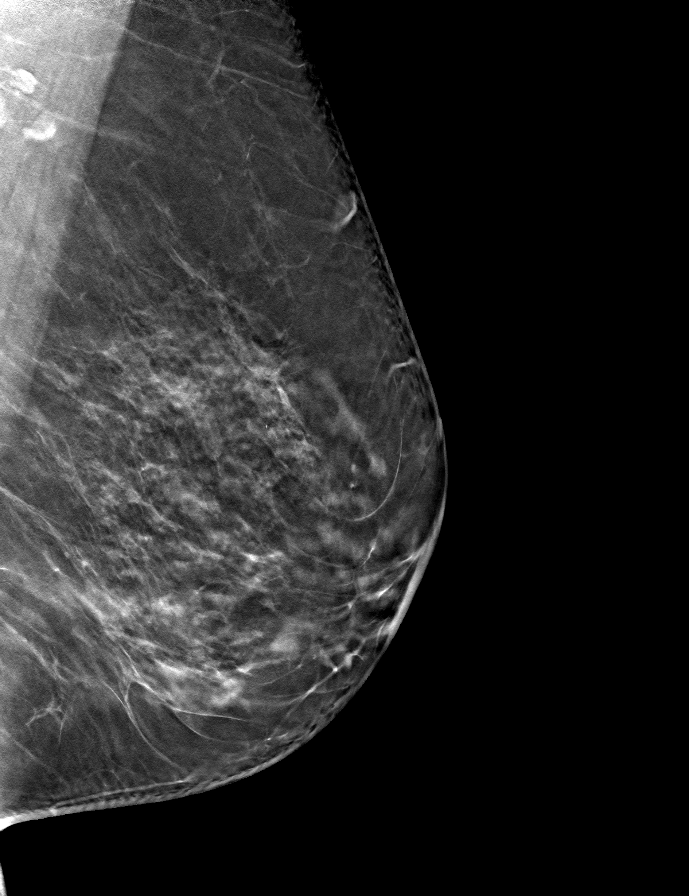
[frame 38/75]
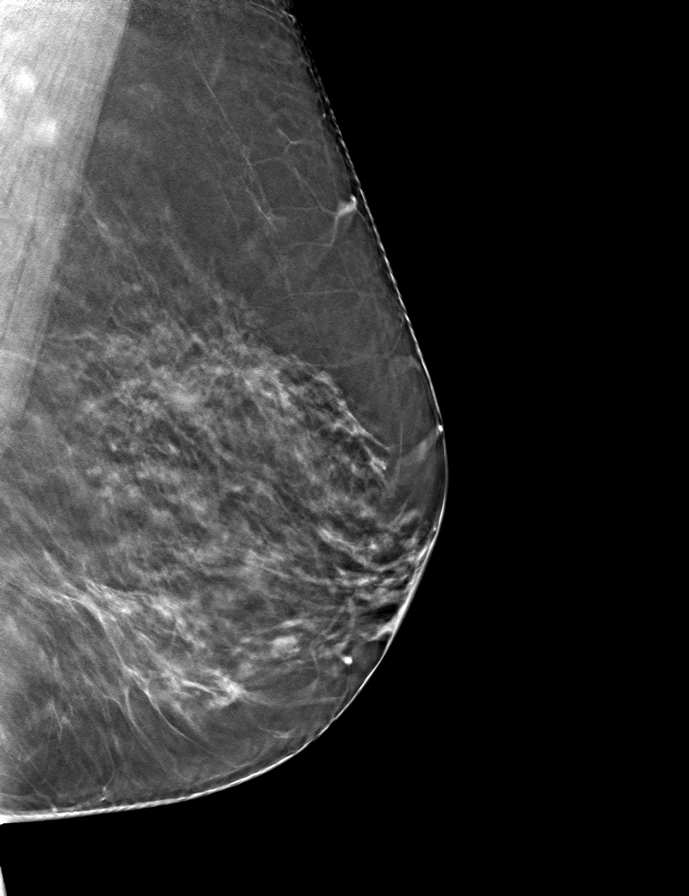

[R CC tomo · tomo slice 41/80.0]
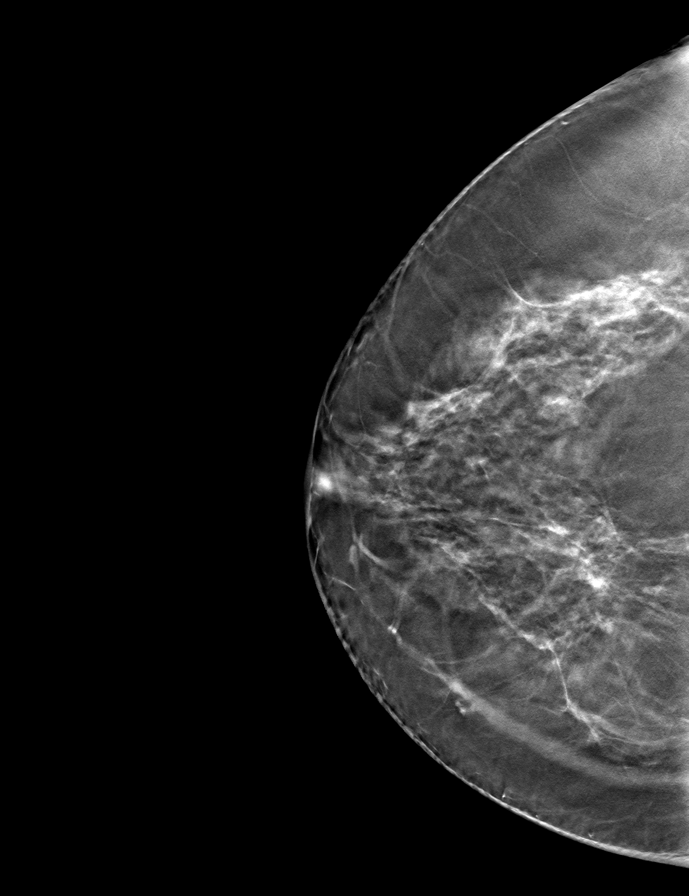

[L CC tomo · tomo slice 41/81.0]
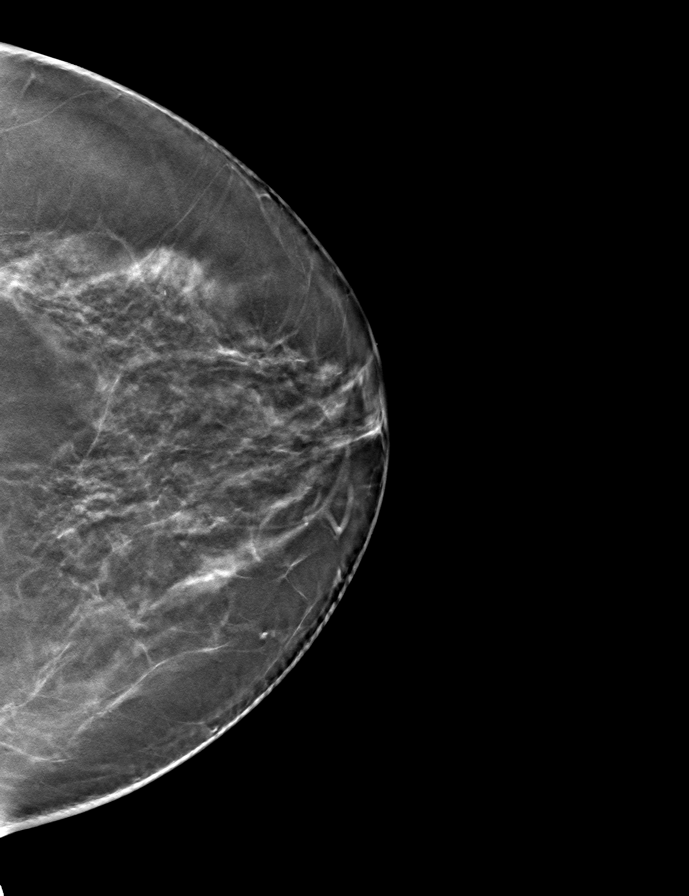

[R MLO tomo · tomo slice 39/76.0]
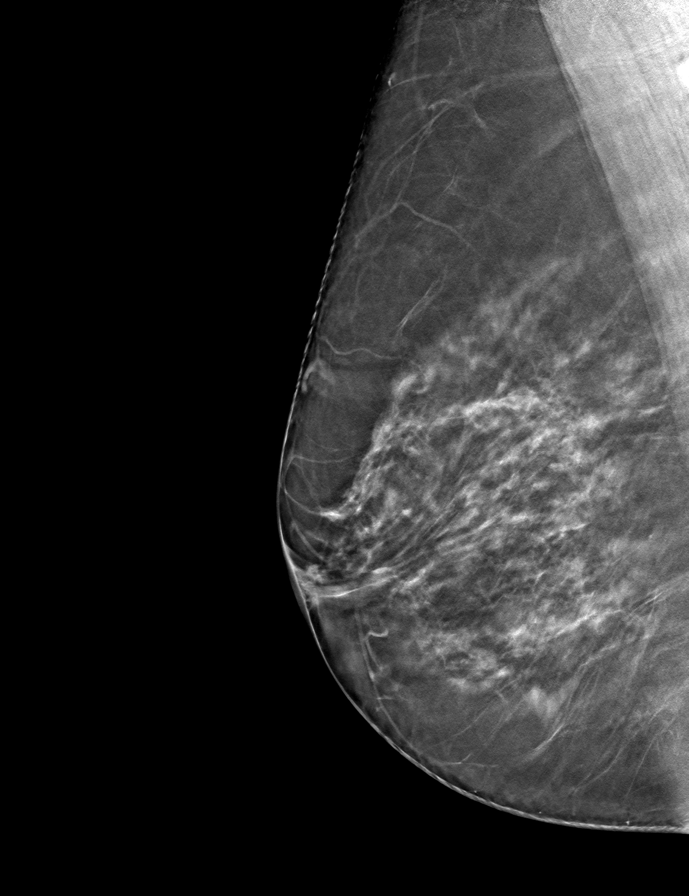

[9 of 24 positions shown; findings below may reference images not displayed]

ACR Breast Density Category b: There are scattered areas of
fibroglandular density.
FINDINGS: There are no findings suspicious for malignancy. Images were
processed with CAD.
IMPRESSION: No mammographic evidence of malignancy. A result letter of this
screening mammogram will be mailed directly to the patient.

RECOMMENDATION:
Screening mammogram in one year. (Code:[id number])

BI-RADS CATEGORY  1: Negative.

## 2020-04-05 ENCOUNTER — Ambulatory Visit: Payer: BC Managed Care – PPO | Admitting: Family Medicine

## 2020-05-10 ENCOUNTER — Ambulatory Visit: Payer: Self-pay | Admitting: Family Medicine

## 2020-05-31 ENCOUNTER — Ambulatory Visit: Payer: Self-pay | Admitting: Family Medicine

## 2020-06-15 DIAGNOSIS — N951 Menopausal and female climacteric states: Secondary | ICD-10-CM | POA: Insufficient documentation

## 2020-06-15 DIAGNOSIS — R87612 Low grade squamous intraepithelial lesion on cytologic smear of cervix (LGSIL): Secondary | ICD-10-CM | POA: Insufficient documentation

## 2020-06-15 DIAGNOSIS — I7300 Raynaud's syndrome without gangrene: Secondary | ICD-10-CM | POA: Insufficient documentation

## 2020-06-15 DIAGNOSIS — N920 Excessive and frequent menstruation with regular cycle: Secondary | ICD-10-CM | POA: Insufficient documentation

## 2020-06-15 DIAGNOSIS — G479 Sleep disorder, unspecified: Secondary | ICD-10-CM | POA: Insufficient documentation

## 2020-06-15 DIAGNOSIS — D259 Leiomyoma of uterus, unspecified: Secondary | ICD-10-CM | POA: Insufficient documentation

## 2020-07-26 ENCOUNTER — Ambulatory Visit: Payer: Self-pay | Admitting: Family Medicine

## 2020-08-30 ENCOUNTER — Ambulatory Visit: Payer: Self-pay | Admitting: Family Medicine

## 2020-10-18 ENCOUNTER — Ambulatory Visit: Payer: Self-pay | Admitting: Family Medicine

## 2020-11-22 ENCOUNTER — Ambulatory Visit: Payer: Self-pay | Admitting: Family Medicine

## 2021-02-07 ENCOUNTER — Ambulatory Visit: Payer: Self-pay | Admitting: Family Medicine

## 2021-04-04 ENCOUNTER — Ambulatory Visit: Payer: Self-pay | Admitting: Family Medicine

## 2021-05-16 ENCOUNTER — Telehealth: Payer: No Typology Code available for payment source | Admitting: Family Medicine

## 2021-08-09 ENCOUNTER — Telehealth: Payer: Self-pay

## 2021-08-09 NOTE — Telephone Encounter (Signed)
Given to Dr. Neva Seat for review. Once reviewed the progress notes will be scanned in to the chart.

## 2021-08-11 ENCOUNTER — Ambulatory Visit: Payer: No Typology Code available for payment source | Admitting: Family Medicine

## 2021-08-11 ENCOUNTER — Encounter: Payer: Self-pay | Admitting: Family Medicine

## 2021-08-11 VITALS — BP 124/78 | HR 71 | Temp 98.3°F | Resp 16 | Ht 62.0 in | Wt 152.4 lb

## 2021-08-11 DIAGNOSIS — Z8249 Family history of ischemic heart disease and other diseases of the circulatory system: Secondary | ICD-10-CM

## 2021-08-11 DIAGNOSIS — E785 Hyperlipidemia, unspecified: Secondary | ICD-10-CM | POA: Diagnosis not present

## 2021-08-11 DIAGNOSIS — R519 Headache, unspecified: Secondary | ICD-10-CM | POA: Diagnosis not present

## 2021-08-11 DIAGNOSIS — F439 Reaction to severe stress, unspecified: Secondary | ICD-10-CM | POA: Diagnosis not present

## 2021-08-11 DIAGNOSIS — G4700 Insomnia, unspecified: Secondary | ICD-10-CM

## 2021-08-11 MED ORDER — ZOLPIDEM TARTRATE 5 MG PO TABS: 5.0000 mg | ORAL_TABLET | Freq: Every evening | ORAL | 1 refills | Status: AC | PRN

## 2021-08-11 NOTE — Progress Notes (Signed)
Subjective:  Patient ID: Sarah Wilkinson, female    DOB: 1965/03/24  Age: 56 y.o. MRN: [id number]  CC:  Chief Complaint  Patient presents with   Hyperlipidemia    Pt states she is here for follow up for her cholesterol. Pt states there are no concerns currently.    Headache   Insomnia    HPI [name] presents for   Hyperlipidemia: COVID in November.Low ASCVD risk score, family history of cardiac disease in father in 76's. Stents in 60's.  option of statin for coronary calcium scoring. Mom and maternal GM on chol meds in 50's.  No current meds. The 10-year ASCVD risk score (Arnett DK, et al., 2019) is: 4.5%   Values used to calculate the score:     Age: 59 years     Sex: Female     Is Non-Hispanic African American: No     Diabetic: No     Tobacco smoker: Yes     Systolic Blood Pressure: 124 mmHg     Is BP treated: No     HDL Cholesterol: 90.2 mg/dL     Total Cholesterol: 286 mg/dL  Lab Results  Component Value Date   CHOL 286 (H) 08/11/2021   HDL 90.20 08/11/2021   LDLCALC 183 (H) 08/11/2021   TRIG 64.0 08/11/2021   CHOLHDL 3 08/11/2021   Lab Results  Component Value Date   ALT 13 08/11/2021   AST 18 08/11/2021   ALKPHOS 93 08/11/2021   BILITOT 0.7 08/11/2021   Insomnia: Discussed in November, situational stressors at that time with health of family, other obligations.  Intermittent dosing of Ambien has been prescribed by GYN.  Had tried melatonin, Benadryl at times.  Was adding walking for exercise.  Continued Ambien if needed only.  Controlled substance database reviewed.  Last Ambien fill of #30 on 06/21/2020. Very busy. Caring for family, grandchildren and work.  Will have some changes during the summer.  Sometimes difficulty getting to sleep, other times early wakening.  Using melatonin, only occasional benadryl works.up to 30mg  melatonin.  Slight hangover effect with benadryl.  Only used Ambien if awake at 2-3 am. No parasomnias on Ambien. Trying  consistent bedtime.   HA: No change in stress/sinus HA - resolves with excedrin Migraine - less than once per week. Notes with weather changes, stress. Working on .  Denies depression/anxiety.doing well on excedrin migraine as needed only.   Needs bloodwork for colonoscopy for Dr. Acupuncturist - CBC, hep function panel, tsh,BMP.   History Patient Active Problem List   Diagnosis Date Noted   HLD (hyperlipidemia) 12/26/2018   Headache 12/26/2018   Subluxation complex of thoracic region 12/26/2018   No past medical history on file. Past Surgical History:  Procedure Laterality Date   VEIN SURGERY  1999   Allergies  Allergen Reactions   Sulfa Antibiotics    Tetanus Toxoids     1 month of large swollen area on arm. Advised to hold off with further td/tdap.    Prior to Admission medications   Medication Sig Start Date End Date Taking? Authorizing Provider  Calcium Carbonate-Vitamin D (CALCIUM PLUS VITAMIN D) 300-2.5 MG-MCG CAPS Take by mouth.   Yes [provider]  [facility] Liver Oil CAPS Take by mouth.   Yes [provider]  Melatonin-Pyridoxine (MELATIN PO) Take by mouth.   Yes [provider]  Multiple Vitamin (MULTIVITAMIN) capsule Take 1 capsule by mouth daily.   Yes [provider]  zolpidem (  AMBIEN) 5 MG tablet Take 1 tablet (5 mg total) by mouth at bedtime as needed for sleep. 01/19/20  Yes Shade Flood, MD   Social History   Socioeconomic History   Marital status: Married    Spouse name: Not on file   Number of children: Not on file   Years of education: Not on file   Highest education level: Not on file  Occupational History   Not on file  Tobacco Use   Smoking status: Never   Smokeless tobacco: Never  Substance and Sexual Activity   Alcohol use: Yes    Comment: occ   Drug use: Never   Sexual activity: Yes  Other Topics Concern   Not on file  Social History Narrative   Not on file   Social Determinants of Health    Financial Resource Strain: Not on file  Food Insecurity: Not on file  Transportation Needs: Not on file  Physical Activity: Not on file  Stress: Not on file  Social Connections: Not on file  Intimate Partner Violence: Not on file    Review of Systems  Per HPI.  Objective:   Vitals:   08/11/21 1352  BP: 124/78  Pulse: 71  Resp: 16  Temp: 98.3 F (36.8 C)  TempSrc: Temporal  SpO2: 97%  Weight: 152 lb 6.4 oz (69.1 kg)  Height: 5\' 2"  (1.575 m)     Physical Exam Vitals reviewed.  Constitutional:      Appearance: Normal appearance. She is well-developed.  HENT:     Head: Normocephalic and atraumatic.  Eyes:     Conjunctiva/sclera: Conjunctivae normal.     Pupils: Pupils are equal, round, and reactive to light.  Neck:     Vascular: No carotid bruit.  Cardiovascular:     Rate and Rhythm: Normal rate and regular rhythm.     Heart sounds: Normal heart sounds.  Pulmonary:     Effort: Pulmonary effort is normal.     Breath sounds: Normal breath sounds.  Abdominal:     Palpations: Abdomen is soft. There is no pulsatile mass.     Tenderness: There is no abdominal tenderness.  Musculoskeletal:     Right lower leg: No edema.     Left lower leg: No edema.  Skin:    General: Skin is warm and dry.  Neurological:     Mental Status: She is alert and oriented to person, place, and time.  Psychiatric:        Mood and Affect: Mood normal. Mood is not anxious or depressed.        Behavior: Behavior normal.       Assessment & Plan:  MARANATHA GROSSI is a 56 y.o. female . Hyperlipidemia, unspecified hyperlipidemia type - Plan: CT CARDIAC SCORING (SELF PAY ONLY), CBC, Comprehensive metabolic panel, Lipid panel  -Overall low ASCVD risk score, but does have family history as above.  Coronary calcium scoring may shed some more light on whether or not to start a statin.  Ordered.  Check updated labs as well.  Insomnia, unspecified type - Plan: zolpidem (AMBIEN) 5 MG tablet,  CBC, TSH  -Difficulties with sleep onset and maintenance as above, but has been able to overall manage with combination of melatonin, symptoms Benadryl, sometimes Ambien.  Potential side effects discussed with higher doses of melatonin but seems to be tolerating.  No changes for now.  Would consider meeting with sleep psychologist if persistent insomnia.  Situational stress - Plan: TSH  -Appears to  be managing sufficiently, check TSH, as also needed for gastroenterology  Nonintractable episodic headache, unspecified headache type  -Stress/sinus headaches, resolved with over-the-counter Excedrin Migraine.  Continue to monitor for stress management effectiveness, recheck next few months to discuss headaches further and decide on any medication changes.  RTC precautions.  Family history of cardiac disorder in father - Plan: CT CARDIAC SCORING (SELF PAY ONLY)  -As above  Meds ordered this encounter  Medications   zolpidem (AMBIEN) 5 MG tablet    Sig: Take 1 tablet (5 mg total) by mouth at bedtime as needed for sleep.    Dispense:  15 tablet    Refill:  1   Patient Instructions  Lower dose melatonin may be a better idea.  Lowest effective dose of benadryl to lessen the hangover effect. ambien if needed. I would consider meeting with a sleep psychologist if persistent sleep difficulties.  No change in headache meds for now. I would like to discuss this further in next few months. Please schedule at your convenience in next few months.           Signed,   Meredith StaggersJeffrey Garth Diffley, MD [facility], [facility] 08/17/21 7:47 AM

## 2021-08-11 NOTE — Patient Instructions (Addendum)
Lower dose melatonin may be a better idea.  Lowest effective dose of benadryl to lessen the hangover effect. ambien if needed. I would consider meeting with a sleep psychologist if persistent sleep difficulties.  No change in headache meds for now. I would like to discuss this further in next few months. Please schedule at your convenience in next few months.

## 2021-08-12 LAB — LIPID PANEL
Cholesterol: 286 mg/dL — ABNORMAL HIGH (ref 0–200)
HDL: 90.2 mg/dL (ref >39.00)
LDL Cholesterol: 183 mg/dL — ABNORMAL HIGH (ref 0–99)
NonHDL: 196.29
Total CHOL/HDL Ratio: 3
Triglycerides: 64 mg/dL (ref 0.0–149.0)
VLDL: 12.8 mg/dL (ref 0.0–40.0)

## 2021-08-12 LAB — COMPREHENSIVE METABOLIC PANEL
ALT: 13 U/L (ref 0–35)
AST: 18 U/L (ref 0–37)
Albumin: 4.9 g/dL (ref 3.5–5.2)
Alkaline Phosphatase: 93 U/L (ref 39–117)
BUN: 13 mg/dL (ref 6–23)
CO2: 26 mEq/L (ref 19–32)
Calcium: 9.6 mg/dL (ref 8.4–10.5)
Chloride: 100 mEq/L (ref 96–112)
Creatinine, Ser: 0.8 mg/dL (ref 0.40–1.20)
GFR: 82.51 mL/min (ref >60.00)
Glucose, Bld: 78 mg/dL (ref 70–99)
Potassium: 4 mEq/L (ref 3.5–5.1)
Sodium: 137 mEq/L (ref 135–145)
Total Bilirubin: 0.7 mg/dL (ref 0.2–1.2)
Total Protein: 7.6 g/dL (ref 6.0–8.3)

## 2021-08-12 LAB — CBC
HCT: 36.5 % (ref 36.0–46.0)
Hemoglobin: 12.5 g/dL (ref 12.0–15.0)
MCHC: 34.2 g/dL (ref 30.0–36.0)
MCV: 88.2 fl (ref 78.0–100.0)
Platelets: 274 10*3/uL (ref 150.0–400.0)
RBC: 4.15 Mil/uL (ref 3.87–5.11)
RDW: 13.4 % (ref 11.5–15.5)
WBC: 4.1 10*3/uL (ref 4.0–10.5)

## 2021-08-12 LAB — TSH: TSH: 1.18 u[IU]/mL (ref 0.35–5.50)

## 2021-08-23 LAB — HM MAMMOGRAPHY

## 2021-08-26 ENCOUNTER — Encounter: Payer: Self-pay | Admitting: Family Medicine

## 2021-09-22 ENCOUNTER — Telehealth: Payer: Self-pay | Admitting: Family Medicine

## 2021-09-22 NOTE — Telephone Encounter (Signed)
Caller name: Sarah Wilkinson (pt)  On DPR? :yes/no: Yes  Call back number: [phone]  Provider they see: Dr. Neva Seat  Reason for call: Pt states that she was supposed to get a Calcium score/scan.  But pt states that the imaging place that she was referred to is closing and she is not able to get the test done there. Where else can she get this testing done?  Please advise.

## 2021-09-22 NOTE — Telephone Encounter (Signed)
Will look into other facility.

## 2021-09-23 DIAGNOSIS — E785 Hyperlipidemia, unspecified: Secondary | ICD-10-CM

## 2021-09-23 NOTE — Addendum Note (Signed)
Addended by: Meredith Staggers R on: 09/23/2021 03:13 PM   Modules accepted: Orders

## 2021-09-23 NOTE — Telephone Encounter (Signed)
Pt is aware of the contact #  

## 2021-09-23 NOTE — Telephone Encounter (Signed)
Order changed to new location.

## 2021-10-03 ENCOUNTER — Ambulatory Visit (HOSPITAL_COMMUNITY): Payer: No Typology Code available for payment source

## 2021-10-06 ENCOUNTER — Ambulatory Visit (HOSPITAL_COMMUNITY)
Admission: RE | Admit: 2021-10-06 | Discharge: 2021-10-06 | Disposition: A | Discharge status: Home / Self Care | Payer: No Typology Code available for payment source | Source: Ambulatory Visit | Attending: Family Medicine | Admitting: Family Medicine

## 2021-10-06 DIAGNOSIS — E785 Hyperlipidemia, unspecified: Secondary | ICD-10-CM | POA: Insufficient documentation

## 2021-10-06 DIAGNOSIS — Z8249 Family history of ischemic heart disease and other diseases of the circulatory system: Secondary | ICD-10-CM | POA: Insufficient documentation

## 2021-10-13 MED ORDER — PRAVASTATIN SODIUM 20 MG PO TABS: 20.0000 mg | ORAL_TABLET | Freq: Every day | ORAL | 1 refills | Status: DC

## 2022-01-06 ENCOUNTER — Other Ambulatory Visit: Payer: Self-pay

## 2022-01-06 DIAGNOSIS — E785 Hyperlipidemia, unspecified: Secondary | ICD-10-CM

## 2022-01-06 MED ORDER — PRAVASTATIN SODIUM 20 MG PO TABS: 20.0000 mg | ORAL_TABLET | Freq: Every day | ORAL | 1 refills | Status: DC

## 2022-01-31 DIAGNOSIS — M25521 Pain in right elbow: Secondary | ICD-10-CM | POA: Insufficient documentation

## 2022-03-23 DIAGNOSIS — M7521 Bicipital tendinitis, right shoulder: Secondary | ICD-10-CM | POA: Diagnosis not present

## 2022-03-23 DIAGNOSIS — M654 Radial styloid tenosynovitis [de Quervain]: Secondary | ICD-10-CM | POA: Diagnosis not present

## 2022-03-23 DIAGNOSIS — M25532 Pain in left wrist: Secondary | ICD-10-CM | POA: Diagnosis not present

## 2022-03-24 DIAGNOSIS — M7520 Bicipital tendinitis, unspecified shoulder: Secondary | ICD-10-CM | POA: Insufficient documentation

## 2022-03-24 DIAGNOSIS — M25532 Pain in left wrist: Secondary | ICD-10-CM | POA: Insufficient documentation

## 2022-03-24 DIAGNOSIS — M654 Radial styloid tenosynovitis [de Quervain]: Secondary | ICD-10-CM | POA: Insufficient documentation

## 2022-07-04 ENCOUNTER — Other Ambulatory Visit: Payer: Self-pay | Admitting: Family Medicine

## 2022-07-04 DIAGNOSIS — E785 Hyperlipidemia, unspecified: Secondary | ICD-10-CM

## 2022-09-12 DIAGNOSIS — E785 Hyperlipidemia, unspecified: Secondary | ICD-10-CM | POA: Diagnosis not present

## 2022-09-12 DIAGNOSIS — Z823 Family history of stroke: Secondary | ICD-10-CM | POA: Diagnosis not present

## 2022-09-12 DIAGNOSIS — I10 Essential (primary) hypertension: Secondary | ICD-10-CM | POA: Diagnosis not present

## 2022-09-12 DIAGNOSIS — Z973 Presence of spectacles and contact lenses: Secondary | ICD-10-CM | POA: Diagnosis not present

## 2022-09-12 DIAGNOSIS — Z791 Long term (current) use of non-steroidal anti-inflammatories (NSAID): Secondary | ICD-10-CM | POA: Diagnosis not present

## 2022-09-12 DIAGNOSIS — E669 Obesity, unspecified: Secondary | ICD-10-CM | POA: Diagnosis not present

## 2022-09-12 DIAGNOSIS — Z6830 Body mass index (BMI) 30.0-30.9, adult: Secondary | ICD-10-CM | POA: Diagnosis not present

## 2022-09-12 DIAGNOSIS — Z818 Family history of other mental and behavioral disorders: Secondary | ICD-10-CM | POA: Diagnosis not present

## 2022-09-12 DIAGNOSIS — Z882 Allergy status to sulfonamides status: Secondary | ICD-10-CM | POA: Diagnosis not present

## 2022-09-12 DIAGNOSIS — I951 Orthostatic hypotension: Secondary | ICD-10-CM | POA: Diagnosis not present

## 2022-09-12 DIAGNOSIS — Z8249 Family history of ischemic heart disease and other diseases of the circulatory system: Secondary | ICD-10-CM | POA: Diagnosis not present

## 2022-09-12 DIAGNOSIS — M1990 Unspecified osteoarthritis, unspecified site: Secondary | ICD-10-CM | POA: Diagnosis not present

## 2022-11-15 ENCOUNTER — Ambulatory Visit: Payer: Self-pay | Admitting: Family Medicine

## 2022-11-27 ENCOUNTER — Encounter: Payer: Self-pay | Admitting: Family Medicine

## 2022-11-27 ENCOUNTER — Ambulatory Visit (INDEPENDENT_AMBULATORY_CARE_PROVIDER_SITE_OTHER): Payer: 59 | Admitting: Family Medicine

## 2022-11-27 VITALS — BP 116/68 | HR 72 | Temp 97.9°F | Wt 163.4 lb

## 2022-11-27 DIAGNOSIS — G4700 Insomnia, unspecified: Secondary | ICD-10-CM

## 2022-11-27 DIAGNOSIS — E785 Hyperlipidemia, unspecified: Secondary | ICD-10-CM | POA: Diagnosis not present

## 2022-11-27 MED ORDER — PRAVASTATIN SODIUM 20 MG PO TABS: 20.0000 mg | ORAL_TABLET | Freq: Every day | ORAL | 1 refills | Status: DC

## 2022-11-27 NOTE — Patient Instructions (Addendum)
Glad to hear that the cholesterol medicine has been well-tolerated.  Depending on labs today we can try increasing the dose of that same medication if needed prior to switching meds.  I will let you know once I review results.  Let me know the other sleep medicine that was effective and we can try that in place of Ambien.  Take care!

## 2022-11-27 NOTE — Progress Notes (Signed)
Subjective:  Patient ID: Sarah Wilkinson, female    DOB: [birth year]  Age: 57 y.o. MRN: [id number]  CC:  Chief Complaint  Patient presents with   Medical Management of Chronic Issues    Med and cholesterol     HPI Sarah Wilkinson presents for   Hyperlipidemia: Previously low ASCVD risk score, but family history of cardiac disease with her father in his 24s.  Stents in the 60s.  Mother and maternal grandmother on cholesterol meds in their 55s.Coronary calcium scoring test on 10/06/2021 with score of 0.61 which was 72nd percentile for age.  Given these results recommended low-dose statin considering her family history, option of intermittent statin dosing. Has been prescribed pravastatin 20 mg. Taking nightly. No side effects.  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  Lab Results  Component Value Date   CHOL 286 (H) 08/11/2021   HDL 90.20 08/11/2021   LDLCALC 183 (H) 08/11/2021   TRIG 64.0 08/11/2021   CHOLHDL 3 08/11/2021   Lab Results  Component Value Date   ALT 13 08/11/2021   AST 18 08/11/2021   ALKPHOS 93 08/11/2021   BILITOT 0.7 08/11/2021   Insomnia Intermittent dosing of Ambien previously.  Some difficulty with sleep onset or early awakening in the past.  Benadryl causes daytime sedation.  Melatonin helped some but would only take Ambien if awake late in the evening.  Denied parasomnias on Ambien. Rare need for Remus Loffler now. Melatonin.  Still has nights that could use if unable to get to sleep by 2 am.  Has tried a med from her mother - worked well - did not feel hangover effect like benadryl, and woke up well. Will let me know that med name to Rx in place of ambien. ? Hydroxyzine.   HM: Pap testing, followed by gynecology - due in October - has appt with GYN.  Flu vaccine and covid booster at [facility] recently.  Tdap: tetanus allergy.  Shingrix both at pharmacy.  Immunization History  Administered Date(s) Administered    Covid-19, Mrna,Vaccine(Spikevax)1yrs and older 11/24/2022   Influenza Split 01/03/2017   Influenza,inj,Quad PF,6+ Mos 12/26/2018   Influenza-Unspecified 01/10/2020   PFIZER(Purple Top)SARS-COV-2 Vaccination 05/11/2019, 06/10/2019      History Patient Active Problem List   Diagnosis Date Noted   HLD (hyperlipidemia) 12/26/2018   Headache 12/26/2018   Subluxation complex of thoracic region 12/26/2018   No past medical history on file. Past Surgical History:  Procedure Laterality Date   VEIN SURGERY  1999   Allergies  Allergen Reactions   Sulfa Antibiotics    Tetanus Toxoids     1 month of large swollen area on arm. Advised to hold off with further td/tdap.    Prior to Admission medications   Medication Sig Start Date End Date Taking? Authorizing Provider  cetirizine (ZYRTEC) 5 MG tablet Take 5 mg by mouth daily.   Yes [provider]  Calcium Carbonate-Vitamin D (CALCIUM PLUS VITAMIN D) 300-2.5 MG-MCG CAPS Take by mouth.    [provider]  [facility] Liver Oil CAPS Take by mouth.    [provider]  Melatonin-Pyridoxine (MELATIN PO) Take by mouth.    [provider]  Multiple Vitamin (MULTIVITAMIN) capsule Take 1 capsule by mouth daily.    [provider]  pravastatin (PRAVACHOL) 20 MG tablet TAKE 1 TABLET BY MOUTH EVERY DAY 07/04/22   Shade Flood, MD  zolpidem (AMBIEN) 5 MG tablet Take 1  tablet (5 mg total) by mouth at bedtime as needed for sleep. 08/11/21   Shade Flood, MD   Social History   Socioeconomic History   Marital status: Married    Spouse name: Not on file   Number of children: Not on file   Years of education: Not on file   Highest education level: Not on file  Occupational History   Not on file  Tobacco Use   Smoking status: Never   Smokeless tobacco: Never  Substance and Sexual Activity   Alcohol use: Yes    Comment: occ   Drug use: Never   Sexual activity: Yes  Other Topics Concern   Not on file   Social History Narrative   Not on file   Social Determinants of Health   Financial Resource Strain: Not on file  Food Insecurity: Not on file  Transportation Needs: Not on file  Physical Activity: Not on file  Stress: Not on file  Social Connections: Not on file  Intimate Partner Violence: Not on file    Review of Systems  Constitutional:  Negative for fatigue and unexpected weight change.  Respiratory:  Negative for chest tightness and shortness of breath.   Cardiovascular:  Negative for chest pain, palpitations and leg swelling.  Gastrointestinal:  Negative for abdominal pain and blood in stool.  Neurological:  Negative for dizziness, syncope, light-headedness and headaches.     Objective:   Vitals:   11/27/22 1450  BP: 116/68  Pulse: 72  Temp: 97.9 F (36.6 C)  TempSrc: Temporal  SpO2: 99%  Weight: 163 lb 6.4 oz (74.1 kg)     Physical Exam Vitals reviewed.  Constitutional:      Appearance: Normal appearance. She is well-developed.  HENT:     Head: Normocephalic and atraumatic.  Eyes:     Conjunctiva/sclera: Conjunctivae normal.     Pupils: Pupils are equal, round, and reactive to light.  Neck:     Vascular: No carotid bruit.  Cardiovascular:     Rate and Rhythm: Normal rate and regular rhythm.     Heart sounds: Normal heart sounds.  Pulmonary:     Effort: Pulmonary effort is normal.     Breath sounds: Normal breath sounds.  Abdominal:     Palpations: Abdomen is soft. There is no pulsatile mass.     Tenderness: There is no abdominal tenderness.  Musculoskeletal:     Right lower leg: No edema.     Left lower leg: No edema.  Skin:    General: Skin is warm and dry.  Neurological:     Mental Status: She is alert and oriented to person, place, and time.  Psychiatric:        Mood and Affect: Mood normal.        Behavior: Behavior normal.        Assessment & Plan:  Sarah Wilkinson is a 57 y.o. female . Hyperlipidemia, unspecified hyperlipidemia  type - Plan: Lipid panel, Comprehensive metabolic panel, pravastatin (PRAVACHOL) 20 MG tablet  -Tolerating pravastatin, consider higher dosing if LDL still above goal as she has tolerated this med.  Could try this approach before switching to Crestor or Lipitor.  Adjustment of plan based on labs.  -Okay to continue Voltaren gel for arthralgia pain, RTC precautions if persistent or ineffective.  Insomnia, unspecified type  -Overall stable with melatonin with rare need for Ambien previously.  She did try family ember's medication that was tolerated better than Benadryl, not controlled med, I  suspect this was hydroxyzine but she will verify and let me know by MyChart.  I am okay sending in some hydroxyzine if needed in place of Ambien.  Meds ordered this encounter  Medications   pravastatin (PRAVACHOL) 20 MG tablet    Sig: Take 1 tablet (20 mg total) by mouth daily.    Dispense:  90 tablet    Refill:  1    Ok to place on hold if needed.   Patient Instructions  Glad to hear that the cholesterol medicine has been well-tolerated.  Depending on labs today we can try increasing the dose of that same medication if needed prior to switching meds.  I will let you know once I review results.  Let me know the other sleep medicine that was effective and we can try that in place of Ambien.  Take care!    Signed,   Meredith Staggers, MD [facility], [facility] 11/27/22 3:26 PM

## 2022-11-27 NOTE — Addendum Note (Signed)
Addended by: Eldred Manges on: 11/27/2022 03:33 PM   Modules accepted: Orders

## 2022-11-28 LAB — COMPREHENSIVE METABOLIC PANEL
ALT: 35 U/L (ref 0–35)
AST: 32 U/L (ref 0–37)
Albumin: 4.4 g/dL (ref 3.5–5.2)
Alkaline Phosphatase: 115 U/L (ref 39–117)
BUN: 13 mg/dL (ref 6–23)
CO2: 27 meq/L (ref 19–32)
Calcium: 9.9 mg/dL (ref 8.4–10.5)
Chloride: 101 meq/L (ref 96–112)
Creatinine, Ser: 0.79 mg/dL (ref 0.40–1.20)
GFR: 83.01 mL/min (ref >60.00)
Glucose, Bld: 82 mg/dL (ref 70–99)
Potassium: 4 meq/L (ref 3.5–5.1)
Sodium: 137 meq/L (ref 135–145)
Total Bilirubin: 0.3 mg/dL (ref 0.2–1.2)
Total Protein: 7.4 g/dL (ref 6.0–8.3)

## 2022-11-28 LAB — LIPID PANEL
Cholesterol: 184 mg/dL (ref 0–200)
HDL: 74 mg/dL (ref >39.00)
LDL Cholesterol: 93 mg/dL (ref 0–99)
NonHDL: 110.13
Total CHOL/HDL Ratio: 2
Triglycerides: 87 mg/dL (ref 0.0–149.0)
VLDL: 17.4 mg/dL (ref 0.0–40.0)

## 2023-01-05 DIAGNOSIS — G479 Sleep disorder, unspecified: Secondary | ICD-10-CM | POA: Diagnosis not present

## 2023-01-05 DIAGNOSIS — Z01411 Encounter for gynecological examination (general) (routine) with abnormal findings: Secondary | ICD-10-CM | POA: Diagnosis not present

## 2023-01-05 DIAGNOSIS — Z113 Encounter for screening for infections with a predominantly sexual mode of transmission: Secondary | ICD-10-CM | POA: Diagnosis not present

## 2023-01-05 DIAGNOSIS — F419 Anxiety disorder, unspecified: Secondary | ICD-10-CM | POA: Diagnosis not present

## 2023-01-05 DIAGNOSIS — Z1231 Encounter for screening mammogram for malignant neoplasm of breast: Secondary | ICD-10-CM | POA: Diagnosis not present

## 2023-01-05 DIAGNOSIS — Z1331 Encounter for screening for depression: Secondary | ICD-10-CM | POA: Diagnosis not present

## 2023-01-05 DIAGNOSIS — Z0142 Encounter for cervical smear to confirm findings of recent normal smear following initial abnormal smear: Secondary | ICD-10-CM | POA: Diagnosis not present

## 2023-01-05 DIAGNOSIS — Z124 Encounter for screening for malignant neoplasm of cervix: Secondary | ICD-10-CM | POA: Diagnosis not present

## 2023-05-28 ENCOUNTER — Encounter: Payer: 59 | Admitting: Family Medicine

## 2023-06-19 DIAGNOSIS — Z818 Family history of other mental and behavioral disorders: Secondary | ICD-10-CM | POA: Diagnosis not present

## 2023-06-19 DIAGNOSIS — Z8249 Family history of ischemic heart disease and other diseases of the circulatory system: Secondary | ICD-10-CM | POA: Diagnosis not present

## 2023-06-19 DIAGNOSIS — I10 Essential (primary) hypertension: Secondary | ICD-10-CM | POA: Diagnosis not present

## 2023-06-19 DIAGNOSIS — Z823 Family history of stroke: Secondary | ICD-10-CM | POA: Diagnosis not present

## 2023-06-19 DIAGNOSIS — E785 Hyperlipidemia, unspecified: Secondary | ICD-10-CM | POA: Diagnosis not present

## 2023-06-19 DIAGNOSIS — M1990 Unspecified osteoarthritis, unspecified site: Secondary | ICD-10-CM | POA: Diagnosis not present

## 2023-06-29 ENCOUNTER — Other Ambulatory Visit: Payer: Self-pay | Admitting: Family Medicine

## 2023-06-29 DIAGNOSIS — E785 Hyperlipidemia, unspecified: Secondary | ICD-10-CM

## 2023-07-09 DIAGNOSIS — M25561 Pain in right knee: Secondary | ICD-10-CM | POA: Diagnosis not present

## 2023-07-09 DIAGNOSIS — M25562 Pain in left knee: Secondary | ICD-10-CM | POA: Diagnosis not present

## 2023-10-23 ENCOUNTER — Other Ambulatory Visit: Payer: Self-pay

## 2023-10-23 ENCOUNTER — Telehealth: Payer: Self-pay | Admitting: Family Medicine

## 2023-10-23 DIAGNOSIS — E785 Hyperlipidemia, unspecified: Secondary | ICD-10-CM

## 2023-10-23 MED ORDER — PRAVASTATIN SODIUM 20 MG PO TABS: 20.0000 mg | ORAL_TABLET | Freq: Every day | ORAL | 1 refills | Status: DC

## 2023-10-23 NOTE — Telephone Encounter (Signed)
 Encourage patient to contact the pharmacy for refills or they can request refills through [facility]  (Please schedule appointment if patient has not been seen in over a year)    WHAT PHARMACY WOULD THEY LIKE THIS SENT TO: [facility] - [place], [place] - [address]   MEDICATION NAME & DOSE: pravastatin  (PRAVACHOL ) 20 MG tablet   NOTES/COMMENTS FROM PATIENT: Patient changed insurance effective 10/12/23 - insurance stated that we need to send a new prescription to the mail order pharmacy on file so that it can be refilled     Front office please notify patient: It takes 48-72 hours to process rx refill requests Ask patient to call pharmacy to ensure rx is ready before heading there.

## 2023-10-23 NOTE — Telephone Encounter (Signed)
LVM stating refill has been sent in.

## 2023-10-23 NOTE — Telephone Encounter (Signed)
Refill has been sent in to requested pharmacy.  

## 2023-11-14 ENCOUNTER — Other Ambulatory Visit (HOSPITAL_COMMUNITY): Payer: Self-pay

## 2024-03-26 ENCOUNTER — Encounter: Payer: Self-pay | Admitting: Family Medicine

## 2024-03-26 ENCOUNTER — Ambulatory Visit: Payer: Self-pay | Admitting: Family Medicine

## 2024-03-26 VITALS — BP 108/78 | HR 65 | Temp 98.3°F | Resp 15 | Ht 62.0 in | Wt 145.8 lb

## 2024-03-26 DIAGNOSIS — R059 Cough, unspecified: Secondary | ICD-10-CM

## 2024-03-26 DIAGNOSIS — R519 Headache, unspecified: Secondary | ICD-10-CM | POA: Diagnosis not present

## 2024-03-26 DIAGNOSIS — Z1329 Encounter for screening for other suspected endocrine disorder: Secondary | ICD-10-CM | POA: Diagnosis not present

## 2024-03-26 DIAGNOSIS — Z131 Encounter for screening for diabetes mellitus: Secondary | ICD-10-CM

## 2024-03-26 DIAGNOSIS — Z0000 Encounter for general adult medical examination without abnormal findings: Secondary | ICD-10-CM

## 2024-03-26 DIAGNOSIS — Z130 Encounter for screening for diseases of the blood and blood-forming organs and certain disorders involving the immune mechanism: Secondary | ICD-10-CM

## 2024-03-26 DIAGNOSIS — E785 Hyperlipidemia, unspecified: Secondary | ICD-10-CM | POA: Diagnosis not present

## 2024-03-26 DIAGNOSIS — E669 Obesity, unspecified: Secondary | ICD-10-CM | POA: Insufficient documentation

## 2024-03-26 DIAGNOSIS — Z1211 Encounter for screening for malignant neoplasm of colon: Secondary | ICD-10-CM | POA: Insufficient documentation

## 2024-03-26 DIAGNOSIS — E66811 Obesity, class 1: Secondary | ICD-10-CM | POA: Insufficient documentation

## 2024-03-26 DIAGNOSIS — Z860100 Personal history of colon polyps, unspecified: Secondary | ICD-10-CM | POA: Insufficient documentation

## 2024-03-26 MED ORDER — PRAVASTATIN SODIUM 20 MG PO TABS: 20.0000 mg | ORAL_TABLET | Freq: Every day | ORAL | 2 refills | Status: AC

## 2024-03-26 MED ORDER — AIRSUPRA 90-80 MCG/ACT IN AERO: 1.0000 | INHALATION_SPRAY | Freq: Four times a day (QID) | RESPIRATORY_TRACT | 1 refills | Status: AC | PRN

## 2024-03-26 MED ORDER — PRAVASTATIN SODIUM 20 MG PO TABS: 20.0000 mg | ORAL_TABLET | Freq: Every day | ORAL | 1 refills | Status: DC

## 2024-03-26 NOTE — Progress Notes (Signed)
 "  Subjective:  Patient ID: Sarah Wilkinson, female    DOB: 1965/04/18  Age: 59 y.o. MRN: [id number]  CC:  Chief Complaint  Patient presents with   Annual Exam    Patient has always have HA's. Seem to be getting worse. Has a cough that appears certain times a year    HPI Sarah Wilkinson presents for Annual Exam And concerns above  Will need new pharmacy with insurance - not sure what is covered.  PCP, me Gynecology, Dr. Kandyce.  Office visit 03/24/2024. GI, Dr. Kristie Optho - Dr. Cleotilde.  Ortho - Dr. Kay.   History of chronic headaches.   Discussed in past, thought to have component of stress or sinus headaches that improved with Excedrin, with migraines less than once per week with weather changes or stress when discussed in 2023.  Excedrin as treatment. Still stress, tension HA'a. 2 times per month - takes execrin migraine if HA moves to back of head. Resolves quickly. Same HA's as in 20's - no changes. Some months not at all. No HA specialist. Declines referral.   Cough: Noted in her 20's, cough with temp change. Thought to be asthmatic type cough. Still random, rare cough with coughing fits - usually illness or in springtime/allergies. Albuterol in past - has old/outdated rx. Last coughing fit in May 2025. Intermittent cough - up to few days, sometimes more rare. Last May - had coughing fit, pain in HA, migraine, with vomiting, photophobia. Took excedrin migraine - resolved in 40 minutes. HA resolved. 2 days later - coughing fit, slight HA and resolved with excedrin in 20 minutes.  This was first time cough caused HA. Some slight HA with stress of having to recheck migraine.  Denies heartburn, on zyrtec daily. Still some PND.  No recent flonase.  No dyspnea, but slight wheeze sound with breathing out.    Hyperlipidemia: Family history of cardiac disease, father in his 46s.  Stents in his 60s.  Mother and maternal grandmother on cholesterol meds in their 57s.  Coronary calcium  scoring in 2023 at 72nd percentile for age.  On pravastatin  20 mg daily. No new side effects.  Lab Results  Component Value Date   CHOL 184 11/27/2022   HDL 74.00 11/27/2022   LDLCALC 93 11/27/2022   TRIG 87.0 11/27/2022   CHOLHDL 2 11/27/2022   Lab Results  Component Value Date   ALT 35 11/27/2022   AST 32 11/27/2022   ALKPHOS 115 11/27/2022   BILITOT 0.3 11/27/2022   Insomnia/anxiety Had been treated with intermittent dosing of Ambien  previously.  Rare need when discussed in September.  Hydroxyzine option previously as well. No recent need for hydroxyzine, but has refill from GYN.  No further use of Ambien .  PHQ, GAD7 scores of 0.  Feels like handling stress ok with various stresses.       No data to display              08/11/2021    1:55 PM 01/19/2020    2:30 PM 12/26/2018    9:05 AM 02/14/2016    1:52 PM  Depression screen PHQ 2/9  Decreased Interest 0 0 0 0  Down, Depressed, Hopeless 0 0 0 0  PHQ - 2 Score 0 0 0 0  Altered sleeping 1     Tired, decreased energy 0     Change in appetite 0     Feeling bad or failure about yourself  0     Trouble  concentrating 0     Moving slowly or fidgety/restless 0     Suicidal thoughts 0     PHQ-9 Score 1      Difficult doing work/chores Not difficult at all        Data saved with a previous flowsheet row definition    Health Maintenance  Topic Date Due   Hepatitis B Vaccines 19-59 Average Risk (1 of 3 - 19+ 3-dose series) Never done   Zoster Vaccines- Shingrix (2 of 2) 10/02/2019   Cervical Cancer Screening (HPV/Pap Cotest)  01/31/2021   Pneumococcal Vaccine: 50+ Years (1 of 1 - PCV) 03/26/2025 (Originally 06/09/2015)   Mammogram  03/24/2026   Colonoscopy  05/20/2026   Influenza Vaccine  Completed   COVID-19 Vaccine  Completed   Hepatitis C Screening  Completed   HIV Screening  Completed   HPV VACCINES  Aged Out   Meningococcal B Vaccine  Aged Out   DTaP/Tdap/Td  Discontinued  Colonoscopy 05/19/2016.  One 7 mm  polyp removed, repeat in 5 to 10 years.  Dr. Kristie, repeat in 2023?  Followed by gynecology with recent visit as above. Recent mammogram - will need to repeat in 2 weeks.   Immunization History  Administered Date(s) Administered   Fluzone Influenza virus vaccine,trivalent (IIV3), split virus 02/03/2015   Influenza Split 01/03/2017   Influenza, Seasonal, Injecte, Preservative Fre 02/03/2013, 02/04/2014, 11/24/2022, 01/04/2024   Influenza,inj,Quad PF,6+ Mos 11/25/2015, 12/26/2018, 12/21/2021   Influenza-Unspecified 01/10/2020   Moderna Covid-19 Fall Seasonal Vaccine 75yrs & older 01/04/2024   PFIZER(Purple Top)SARS-COV-2 Vaccination 05/11/2019, 06/10/2019   Tetanus 11/12/2006, 11/15/2006   Unspecified SARS-COV-2 Vaccination 12/21/2021   Zoster Recombinant(Shingrix) 08/07/2019  Had 2nd shingrix at pharmacy prior.   No results found. Optho yearly as above.   Dental: plans on appt - usually yearly or more, due.   Alcohol: none recent, rare.   Tobacco: none.   Exercise: walking. Weight has improved.   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 No pertinent past medical  history. Past Surgical History:  Procedure Laterality Date   VEIN SURGERY  1999   Allergies[1] Prior to Admission medications  Medication Sig Start Date End Date Taking? Authorizing Provider  Calcium Carbonate-Vitamin D (CALCIUM PLUS VITAMIN D) 300-2.5 MG-MCG CAPS Take by mouth.    [provider]  cetirizine (ZYRTEC) 5 MG tablet Take 5 mg by mouth daily.    [provider]  [facility] Liver Oil CAPS Take by mouth.    [provider]  Melatonin-Pyridoxine (MELATIN PO) Take by mouth.    [provider]  Multiple Vitamin (MULTIVITAMIN) capsule Take 1 capsule by mouth daily.  [provider]  pravastatin  (PRAVACHOL ) 20 MG tablet Take 1 tablet (20 mg total) by mouth daily. 10/23/23   Levora Sarah SAUNDERS, MD  zolpidem  (AMBIEN ) 5 MG tablet Take 1 tablet (5 mg total) by mouth at bedtime as needed for sleep. 08/11/21   Levora Sarah SAUNDERS, MD   Social History   Socioeconomic History   Marital status: Married    Spouse name: Not on file   Number of children: Not on file   Years of education: Not on file   Highest education level: Not on file  Occupational History   Not on file  Tobacco Use   Smoking status: Never   Smokeless tobacco: Never  Vaping Use   Vaping status: Never Used  Substance and Sexual Activity   Alcohol use: Yes    Comment: occ   Drug use: Never   Sexual activity: Yes  Other Topics Concern   Not on file  Social History Narrative   Not on file   Social Drivers of Health   Tobacco Use: Low Risk (03/26/2024)   Patient History    Smoking Tobacco Use: Never    Smokeless Tobacco Use: Never    Passive Exposure: Not on file  Financial Resource Strain: Not on file  Food Insecurity: Not on file  Transportation Needs: Not on file  Physical Activity: Not on file  Stress: Not on file  Social Connections: Not on file  Intimate Partner Violence: Not on file  Depression (PHQ2-9): Low Risk (08/11/2021)   Depression (PHQ2-9)    PHQ-2 Score:  1  Alcohol Screen: Not on file  Housing: Not on file  Utilities: Not on file  Health Literacy: Not on file    Review of Systems   Objective:   Vitals:   03/26/24 1352  BP: 108/78  Pulse: 65  Resp: 15  Temp: 98.3 F (36.8 C)  TempSrc: Temporal  SpO2: 97%  Weight: 145 lb 12.8 oz (66.1 kg)  Height: 5' 2 (1.575 m)     Physical Exam Constitutional:      Appearance: She is well-developed.  HENT:     Head: Normocephalic and atraumatic.     Right Ear: External ear normal.     Left Ear: External ear normal.  Eyes:     Conjunctiva/sclera: Conjunctivae normal.     Pupils: Pupils are equal, round, and reactive to light.  Neck:     Thyroid : No thyromegaly.  Cardiovascular:     Rate and Rhythm: Normal rate and regular rhythm.     Heart sounds: Normal heart sounds. No murmur heard. Pulmonary:     Effort: Pulmonary effort is normal. No respiratory distress.     Breath sounds: Normal breath sounds. No wheezing.  Abdominal:     General: Bowel sounds are normal.     Palpations: Abdomen is soft.     Tenderness: There is no abdominal tenderness.  Musculoskeletal:        General: No tenderness. Normal range of motion.     Cervical back: Normal range of motion and neck supple.  Lymphadenopathy:     Cervical: No cervical adenopathy.  Skin:    General: Skin is warm and dry.     Findings: No rash.  Neurological:     General: No focal deficit present.     Mental Status: She is alert and oriented to person, place, and time. Mental status is at baseline.  Psychiatric:        Behavior: Behavior normal.  Thought Content: Thought content normal.        Assessment & Plan:  Sarah Wilkinson is a 59 y.o. female . Annual physical exam  - -anticipatory guidance as below in AVS, screening labs above. Health maintenance items as above in HPI discussed/recommended as applicable.   Hyperlipidemia, unspecified hyperlipidemia type - Plan: pravastatin  (PRAVACHOL ) 20 MG tablet,  Comprehensive metabolic panel with GFR, Lipid panel, DISCONTINUED: pravastatin  (PRAVACHOL ) 20 MG tablet  - Tolerating current meds, continue same.  Cough, unspecified type - Plan: Albuterol-Budesonide (AIRSUPRA ) 90-80 MCG/ACT AERO  - Intermittent cough, likely component of postnasal drip with differential including cough variant asthma with prior benefit with albuterol.  She does note that at times in the past with these cough flares that a single albuterol treatment has not had lasting effect, potentially may receive more benefit with combined albuterol budesonide.  Voucher given if this is covered, if not can change prescription to plain albuterol.  Continue antihistamine, recommended trial of Flonase for postnasal drip as possible trigger.  RTC precautions given.   Intermittent headache  - Infrequent headache, tension/stress headache likely versus migraine versus combined headaches.  Typically improve quickly with Excedrin Migraine.  Only new feature of headaches with coughing noted last May but no other new symptoms or change in frequency.  Given this change I did discuss meeting with a headache specialist but that was declined at this time.  I am happy to place that referral if she changes her mind.  For now we will continue to monitor, has Excedrin Migraine as needed, and treatment of possible cough triggers as above.  Screening for diabetes mellitus - Plan: Hemoglobin A1c  Screening, anemia, deficiency, iron - Plan: CBC  Screening for thyroid  disorder - Plan: TSH   Meds ordered this encounter  Medications   DISCONTD: pravastatin  (PRAVACHOL ) 20 MG tablet    Sig: Take 1 tablet (20 mg total) by mouth daily.    Dispense:  90 tablet    Refill:  1   pravastatin  (PRAVACHOL ) 20 MG tablet    Sig: Take 1 tablet (20 mg total) by mouth daily.    Dispense:  90 tablet    Refill:  2   Albuterol-Budesonide (AIRSUPRA ) 90-80 MCG/ACT AERO    Sig: Inhale 1 Inhalation into the lungs every 6 (six) hours  as needed.    Dispense:  5.9 g    Refill:  1   Patient Instructions  Try Flonase nasal spray in addition to Zyrtec for the postnasal drip as that may be contributing to the cough.  If that does not help you can try Zantac over-the-counter in case there may be a heartburn component of the cough.  I will write for a new inhaler to use if you do have a flare of cough or possible reactive airway/asthma but if that is required frequently let me know and we can look into other causes.  If that inhaler is not covered with a discount card, let me know and I will write just plain albuterol.  Okay to continue Excedrin Migraine if needed for headaches but I would recommend meeting with a headache specialist to discuss any changes in headaches or further workup.  Let me know if you would like me to refer you.   Thank you for coming in today. No change in medications at this time. If there are any concerns on your bloodwork, I will let you know. Take care!  Preventive Care 11-8 Years Old, Female Preventive care refers to lifestyle  choices and visits with your health care provider that can promote health and wellness. Preventive care visits are also called wellness exams. What can I expect for my preventive care visit? Counseling Your health care provider may ask you questions about your: Medical history, including: Past medical problems. Family medical history. Pregnancy history. Current health, including: Menstrual cycle. Method of birth control. Emotional well-being. Home life and relationship well-being. Sexual activity and sexual health. Lifestyle, including: Alcohol, nicotine or tobacco, and drug use. Access to firearms. Diet, exercise, and sleep habits. Work and work astronomer. Sunscreen use. Safety issues such as seatbelt and bike helmet use. Physical exam Your health care provider will check your: Height and weight. These may be used to calculate your BMI (body mass index). BMI is a  measurement that tells if you are at a healthy weight. Waist circumference. This measures the distance around your waistline. This measurement also tells if you are at a healthy weight and may help predict your risk of certain diseases, such as type 2 diabetes and high blood pressure. Heart rate and blood pressure. Body temperature. Skin for abnormal spots. What immunizations do I need?  Vaccines are usually given at various ages, according to a schedule. Your health care provider will recommend vaccines for you based on your age, medical history, and lifestyle or other factors, such as travel or where you work. What tests do I need? Screening Your health care provider may recommend screening tests for certain conditions. This may include: Lipid and cholesterol levels. Diabetes screening. This is done by checking your blood sugar (glucose) after you have not eaten for a while (fasting). Pelvic exam and Pap test. Hepatitis B test. Hepatitis C test. HIV (human immunodeficiency virus) test. STI (sexually transmitted infection) testing, if you are at risk. Lung cancer screening. Colorectal cancer screening. Mammogram. Talk with your health care provider about when you should start having regular mammograms. This may depend on whether you have a family history of breast cancer. BRCA-related cancer screening. This may be done if you have a family history of breast, ovarian, tubal, or peritoneal cancers. Bone density scan. This is done to screen for osteoporosis. Talk with your health care provider about your test results, treatment options, and if necessary, the need for more tests. Follow these instructions at home: Eating and drinking  Eat a diet that includes fresh fruits and vegetables, whole grains, lean protein, and low-fat dairy products. Take vitamin and mineral supplements as recommended by your health care provider. Do not drink alcohol if: Your health care provider tells you not to  drink. You are pregnant, may be pregnant, or are planning to become pregnant. If you drink alcohol: Limit how much you have to 0-1 drink a day. Know how much alcohol is in your drink. In the [place] one drink equals one 12 oz bottle of beer (355 mL), one 5 oz glass of wine (148 mL), or one 1 oz glass of hard liquor (44 mL). Lifestyle Brush your teeth every morning and night with fluoride toothpaste. Floss one time each day. Exercise for at least 30 minutes 5 or more days each week. Do not use any products that contain nicotine or tobacco. These products include cigarettes, chewing tobacco, and vaping devices, such as e-cigarettes. If you need help quitting, ask your health care provider. Do not use drugs. If you are sexually active, practice safe sex. Use a condom or other form of protection to prevent STIs. If you do not wish to become pregnant,  use a form of birth control. If you plan to become pregnant, see your health care provider for a prepregnancy visit. Take aspirin only as told by your health care provider. Make sure that you understand how much to take and what form to take. Work with your health care provider to find out whether it is safe and beneficial for you to take aspirin daily. Find healthy ways to manage stress, such as: Meditation, yoga, or listening to music. Journaling. Talking to a trusted person. Spending time with friends and family. Minimize exposure to UV radiation to reduce your risk of skin cancer. Safety Always wear your seat belt while driving or riding in a vehicle. Do not drive: If you have been drinking alcohol. Do not ride with someone who has been drinking. When you are tired or distracted. While texting. If you have been using any mind-altering substances or drugs. Wear a helmet and other protective equipment during sports activities. If you have firearms in your house, make sure you follow all gun safety procedures. Seek help if you have been  physically or sexually abused. What's next? Visit your health care provider once a year for an annual wellness visit. Ask your health care provider how often you should have your eyes and teeth checked. Stay up to date on all vaccines. This information is not intended to replace advice given to you by your health care provider. Make sure you discuss any questions you have with your health care provider. Document Revised: 08/25/2020 Document Reviewed: 08/25/2020 Elsevier Patient Education  2024 [employer]       Signed,   Sarah Pines, MD [facility], [facility] 03/26/2024 2:47 PM       [1]  Allergies Allergen Reactions   Sulfa Antibiotics    Tetanus Toxoid-Containing Vaccines     1 month of large swollen area on arm. Advised to hold off with further td/tdap.    "

## 2024-03-26 NOTE — Patient Instructions (Addendum)
 Try Flonase nasal spray in addition to Zyrtec for the postnasal drip as that may be contributing to the cough.  If that does not help you can try Zantac over-the-counter in case there may be a heartburn component of the cough.  I will write for a new inhaler to use if you do have a flare of cough or possible reactive airway/asthma but if that is required frequently let me know and we can look into other causes.  If that inhaler is not covered with a discount card, let me know and I will write just plain albuterol.  Okay to continue Excedrin Migraine if needed for headaches but I would recommend meeting with a headache specialist to discuss any changes in headaches or further workup.  Let me know if you would like me to refer you.   Thank you for coming in today. No change in medications at this time. If there are any concerns on your bloodwork, I will let you know. Take care!  Preventive Care 58-65 Years Old, Female Preventive care refers to lifestyle choices and visits with your health care provider that can promote health and wellness. Preventive care visits are also called wellness exams. What can I expect for my preventive care visit? Counseling Your health care provider may ask you questions about your: Medical history, including: Past medical problems. Family medical history. Pregnancy history. Current health, including: Menstrual cycle. Method of birth control. Emotional well-being. Home life and relationship well-being. Sexual activity and sexual health. Lifestyle, including: Alcohol, nicotine or tobacco, and drug use. Access to firearms. Diet, exercise, and sleep habits. Work and work astronomer. Sunscreen use. Safety issues such as seatbelt and bike helmet use. Physical exam Your health care provider will check your: Height and weight. These may be used to calculate your BMI (body mass index). BMI is a measurement that tells if you are at a healthy weight. Waist  circumference. This measures the distance around your waistline. This measurement also tells if you are at a healthy weight and may help predict your risk of certain diseases, such as type 2 diabetes and high blood pressure. Heart rate and blood pressure. Body temperature. Skin for abnormal spots. What immunizations do I need?  Vaccines are usually given at various ages, according to a schedule. Your health care provider will recommend vaccines for you based on your age, medical history, and lifestyle or other factors, such as travel or where you work. What tests do I need? Screening Your health care provider may recommend screening tests for certain conditions. This may include: Lipid and cholesterol levels. Diabetes screening. This is done by checking your blood sugar (glucose) after you have not eaten for a while (fasting). Pelvic exam and Pap test. Hepatitis B test. Hepatitis C test. HIV (human immunodeficiency virus) test. STI (sexually transmitted infection) testing, if you are at risk. Lung cancer screening. Colorectal cancer screening. Mammogram. Talk with your health care provider about when you should start having regular mammograms. This may depend on whether you have a family history of breast cancer. BRCA-related cancer screening. This may be done if you have a family history of breast, ovarian, tubal, or peritoneal cancers. Bone density scan. This is done to screen for osteoporosis. Talk with your health care provider about your test results, treatment options, and if necessary, the need for more tests. Follow these instructions at home: Eating and drinking  Eat a diet that includes fresh fruits and vegetables, whole grains, lean protein, and low-fat dairy products. Take  vitamin and mineral supplements as recommended by your health care provider. Do not drink alcohol if: Your health care provider tells you not to drink. You are pregnant, may be pregnant, or are planning to  become pregnant. If you drink alcohol: Limit how much you have to 0-1 drink a day. Know how much alcohol is in your drink. In the [place] one drink equals one 12 oz bottle of beer (355 mL), one 5 oz glass of wine (148 mL), or one 1 oz glass of hard liquor (44 mL). Lifestyle Brush your teeth every morning and night with fluoride toothpaste. Floss one time each day. Exercise for at least 30 minutes 5 or more days each week. Do not use any products that contain nicotine or tobacco. These products include cigarettes, chewing tobacco, and vaping devices, such as e-cigarettes. If you need help quitting, ask your health care provider. Do not use drugs. If you are sexually active, practice safe sex. Use a condom or other form of protection to prevent STIs. If you do not wish to become pregnant, use a form of birth control. If you plan to become pregnant, see your health care provider for a prepregnancy visit. Take aspirin only as told by your health care provider. Make sure that you understand how much to take and what form to take. Work with your health care provider to find out whether it is safe and beneficial for you to take aspirin daily. Find healthy ways to manage stress, such as: Meditation, yoga, or listening to music. Journaling. Talking to a trusted person. Spending time with friends and family. Minimize exposure to UV radiation to reduce your risk of skin cancer. Safety Always wear your seat belt while driving or riding in a vehicle. Do not drive: If you have been drinking alcohol. Do not ride with someone who has been drinking. When you are tired or distracted. While texting. If you have been using any mind-altering substances or drugs. Wear a helmet and other protective equipment during sports activities. If you have firearms in your house, make sure you follow all gun safety procedures. Seek help if you have been physically or sexually abused. What's next? Visit your health care  provider once a year for an annual wellness visit. Ask your health care provider how often you should have your eyes and teeth checked. Stay up to date on all vaccines. This information is not intended to replace advice given to you by your health care provider. Make sure you discuss any questions you have with your health care provider. Document Revised: 08/25/2020 Document Reviewed: 08/25/2020 [employer]vier Patient Education  2024 [employer].

## 2024-03-27 LAB — COMPREHENSIVE METABOLIC PANEL WITH GFR
ALT: 33 U/L (ref 3–35)
AST: 21 U/L (ref 5–37)
Albumin: 4.9 g/dL (ref 3.5–5.2)
Alkaline Phosphatase: 111 U/L (ref 39–117)
BUN: 19 mg/dL (ref 6–23)
CO2: 30 meq/L (ref 19–32)
Calcium: 9.7 mg/dL (ref 8.4–10.5)
Chloride: 100 meq/L (ref 96–112)
Creatinine, Ser: 0.76 mg/dL (ref 0.40–1.20)
GFR: 86.14 mL/min
Glucose, Bld: 84 mg/dL (ref 70–99)
Potassium: 4.1 meq/L (ref 3.5–5.1)
Sodium: 139 meq/L (ref 135–145)
Total Bilirubin: 0.7 mg/dL (ref 0.2–1.2)
Total Protein: 7.5 g/dL (ref 6.0–8.3)

## 2024-03-27 LAB — TSH: TSH: 1.88 u[IU]/mL (ref 0.35–5.50)

## 2024-03-27 LAB — LIPID PANEL
Cholesterol: 235 mg/dL — ABNORMAL HIGH (ref 28–200)
HDL: 85.7 mg/dL
LDL Cholesterol: 132 mg/dL — ABNORMAL HIGH (ref 10–99)
NonHDL: 148.8
Total CHOL/HDL Ratio: 3
Triglycerides: 83 mg/dL (ref 10.0–149.0)
VLDL: 16.6 mg/dL (ref 0.0–40.0)

## 2024-03-27 LAB — CBC
HCT: 37.9 % (ref 36.0–46.0)
Hemoglobin: 12.8 g/dL (ref 12.0–15.0)
MCHC: 33.6 g/dL (ref 30.0–36.0)
MCV: 89.1 fl (ref 78.0–100.0)
Platelets: 316 K/uL (ref 150.0–400.0)
RBC: 4.26 Mil/uL (ref 3.87–5.11)
RDW: 13.7 % (ref 11.5–15.5)
WBC: 3.8 K/uL — ABNORMAL LOW (ref 4.0–10.5)

## 2024-03-27 LAB — HEMOGLOBIN A1C: Hgb A1c MFr Bld: 5.9 % (ref 4.6–6.5)

## 2024-03-30 ENCOUNTER — Ambulatory Visit: Payer: Self-pay | Admitting: Family Medicine

## 2024-04-01 NOTE — Telephone Encounter (Signed)
 Patient responded, wondering about doubling up on statin

## 2024-09-24 ENCOUNTER — Ambulatory Visit: Admitting: Family Medicine
